# Patient Record
Sex: Female | Born: 1985 | Race: White | Hispanic: No | Marital: Married | Smoking: Never smoker
Health system: Southern US, Community
[De-identification: ages and names within clinical notes are randomized; demographics above are authoritative.]

## PROBLEM LIST (undated history)

## (undated) DIAGNOSIS — N926 Irregular menstruation, unspecified: Secondary | ICD-10-CM

## (undated) DIAGNOSIS — F909 Attention-deficit hyperactivity disorder, unspecified type: Secondary | ICD-10-CM

## (undated) DIAGNOSIS — N979 Female infertility, unspecified: Secondary | ICD-10-CM

## (undated) DIAGNOSIS — F419 Anxiety disorder, unspecified: Secondary | ICD-10-CM

## (undated) DIAGNOSIS — E282 Polycystic ovarian syndrome: Secondary | ICD-10-CM

## (undated) HISTORY — DX: Attention-deficit hyperactivity disorder, unspecified type: F90.9

## (undated) HISTORY — DX: Irregular menstruation, unspecified: N92.6

## (undated) HISTORY — DX: Anxiety disorder, unspecified: F41.9

## (undated) HISTORY — DX: Polycystic ovarian syndrome: E28.2

## (undated) HISTORY — DX: Female infertility, unspecified: N97.9

---

## 2017-12-08 ENCOUNTER — Ambulatory Visit (INDEPENDENT_AMBULATORY_CARE_PROVIDER_SITE_OTHER): Payer: Managed Care, Other (non HMO) | Admitting: Obstetrics and Gynecology

## 2017-12-08 ENCOUNTER — Other Ambulatory Visit (HOSPITAL_COMMUNITY)
Admission: RE | Admit: 2017-12-08 | Discharge: 2017-12-08 | Disposition: A | Discharge status: Home / Self Care | Payer: Managed Care, Other (non HMO) | Source: Ambulatory Visit | Attending: Obstetrics and Gynecology | Admitting: Obstetrics and Gynecology

## 2017-12-08 ENCOUNTER — Other Ambulatory Visit: Payer: Self-pay

## 2017-12-08 ENCOUNTER — Encounter: Payer: Self-pay | Admitting: Obstetrics and Gynecology

## 2017-12-08 VITALS — BP 110/64 | HR 64 | Resp 16 | Ht 68.25 in | Wt 191.0 lb

## 2017-12-08 DIAGNOSIS — R7989 Other specified abnormal findings of blood chemistry: Secondary | ICD-10-CM | POA: Diagnosis not present

## 2017-12-08 DIAGNOSIS — N946 Dysmenorrhea, unspecified: Secondary | ICD-10-CM

## 2017-12-08 DIAGNOSIS — F419 Anxiety disorder, unspecified: Secondary | ICD-10-CM | POA: Diagnosis not present

## 2017-12-08 DIAGNOSIS — Z3009 Encounter for other general counseling and advice on contraception: Secondary | ICD-10-CM | POA: Diagnosis not present

## 2017-12-08 DIAGNOSIS — Z113 Encounter for screening for infections with a predominantly sexual mode of transmission: Secondary | ICD-10-CM | POA: Insufficient documentation

## 2017-12-08 DIAGNOSIS — B9689 Other specified bacterial agents as the cause of diseases classified elsewhere: Secondary | ICD-10-CM | POA: Insufficient documentation

## 2017-12-08 DIAGNOSIS — Z124 Encounter for screening for malignant neoplasm of cervix: Secondary | ICD-10-CM | POA: Diagnosis not present

## 2017-12-08 DIAGNOSIS — Z01419 Encounter for gynecological examination (general) (routine) without abnormal findings: Secondary | ICD-10-CM | POA: Diagnosis not present

## 2017-12-08 MED ORDER — NAPROXEN SODIUM 550 MG PO TABS: 550.0000 mg | ORAL_TABLET | Freq: Two times a day (BID) | ORAL | 2 refills | Status: DC

## 2017-12-08 MED ORDER — ETONOGESTREL-ETHINYL ESTRADIOL 0.12-0.015 MG/24HR VA RING: VAGINAL_RING | VAGINAL | 3 refills | Status: DC

## 2017-12-08 MED ORDER — CITALOPRAM HYDROBROMIDE 20 MG PO TABS: ORAL_TABLET | ORAL | 1 refills | Status: DC

## 2017-12-08 NOTE — Patient Instructions (Signed)
EXERCISE AND DIET:  We recommended that you start or continue a regular exercise program for good health. Regular exercise means any activity that makes your heart beat faster and makes you sweat.  We recommend exercising at least 30 minutes per day at least 3 days a week, preferably 4 or 5.  We also recommend a diet low in fat and sugar.  Inactivity, poor dietary choices and obesity can cause diabetes, heart attack, stroke, and kidney damage, among others.    ALCOHOL AND SMOKING:  Women should limit their alcohol intake to no more than 7 drinks/beers/glasses of wine (combined, not each!) per week. Moderation of alcohol intake to this level decreases your risk of breast cancer and liver damage. And of course, no recreational drugs are part of a healthy lifestyle.  And absolutely no smoking or even second hand smoke. Most people know smoking can cause heart and lung diseases, but did you know it also contributes to weakening of your bones? Aging of your skin?  Yellowing of your teeth and nails?  CALCIUM AND VITAMIN D:  Adequate intake of calcium and Vitamin D are recommended.  The recommendations for exact amounts of these supplements seem to change often, but generally speaking 600 mg of calcium (either carbonate or citrate) and 800 units of Vitamin D per day seems prudent. Certain women may benefit from higher intake of Vitamin D.  If you are among these women, your doctor will have told you during your visit.    PAP SMEARS:  Pap smears, to check for cervical cancer or precancers,  have traditionally been done yearly, although recent scientific advances have shown that most women can have pap smears less often.  However, every woman still should have a physical exam from her gynecologist every year. It will include a breast check, inspection of the vulva and vagina to check for abnormal growths or skin changes, a visual exam of the cervix, and then an exam to evaluate the size and shape of the uterus and  ovaries.  And after 30 years of age, a rectal exam is indicated to check for rectal cancers. We will also provide age appropriate advice regarding health maintenance, like when you should have certain vaccines, screening for sexually transmitted diseases, bone density testing, colonoscopy, mammograms, etc.   MAMMOGRAMS:  All women over 40 years old should have a yearly mammogram. Many facilities now offer a "3D" mammogram, which may cost around $50 extra out of pocket. If possible,  we recommend you accept the option to have the 3D mammogram performed.  It both reduces the number of women who will be called back for extra views which then turn out to be normal, and it is better than the routine mammogram at detecting truly abnormal areas.    COLONOSCOPY:  Colonoscopy to screen for colon cancer is recommended for all women at age 50.  We know, you hate the idea of the prep.  We agree, BUT, having colon cancer and not knowing it is worse!!  Colon cancer so often starts as a polyp that can be seen and removed at colonscopy, which can quite literally save your life!  And if your first colonoscopy is normal and you have no family history of colon cancer, most women don't have to have it again for 10 years.  Once every ten years, you can do something that may end up saving your life, right?  We will be happy to help you get it scheduled when you are ready.    Be sure to check your insurance coverage so you understand how much it will cost.  It may be covered as a preventative service at no cost, but you should check your particular policy.     Ethinyl Estradiol; Etonogestrel vaginal ring What is this medicine? ETHINYL ESTRADIOL; ETONOGESTREL (ETH in il es tra DYE ole; et oh noe JES trel) vaginal ring is a flexible, vaginal ring used as a contraceptive (birth control method). This medicine combines two types of female hormones, an estrogen and a progestin. This ring is used to prevent ovulation and pregnancy. Each  ring is effective for one month. This medicine may be used for other purposes; ask your health care provider or pharmacist if you have questions. COMMON BRAND NAME(S): NuvaRing What should I tell my health care provider before I take this medicine? They need to know if you have or ever had any of these conditions: -abnormal vaginal bleeding -blood vessel disease or blood clots -breast, cervical, endometrial, ovarian, liver, or uterine cancer -diabetes -gallbladder disease -heart disease or recent heart attack -high blood pressure -high cholesterol -kidney disease -liver disease -migraine headaches -stroke -systemic lupus erythematosus (SLE) -tobacco smoker -an unusual or allergic reaction to estrogens, progestins, other medicines, foods, dyes, or preservatives -pregnant or trying to get pregnant -breast-feeding How should I use this medicine? Insert the ring into your vagina as directed. Follow the directions on the prescription label. The ring will remain place for 3 weeks and is then removed for a 1-week break. A new ring is inserted 1 week after the last ring was removed, on the same day of the week. Check often to make sure the ring is still in place, especially before and after sexual intercourse. If the ring was out of the vagina for an unknown amount of time, you may not be protected from pregnancy. Perform a pregnancy test and call your doctor. Do not use more often than directed. A patient package insert for the product will be given with each prescription and refill. Read this sheet carefully each time. The sheet may change frequently. Contact your pediatrician regarding the use of this medicine in children. Special care may be needed. This medicine has been used in female children who have started having menstrual periods. Overdosage: If you think you have taken too much of this medicine contact a poison control center or emergency room at once. NOTE: This medicine is only for  you. Do not share this medicine with others. What if I miss a dose? You will need to replace your vaginal ring once a month as directed. If the ring should slip out, or if you leave it in longer or shorter than you should, contact your health care professional for advice. What may interact with this medicine? Do not take this medicine with the following medication: -dasabuvir; ombitasvir; paritaprevir; ritonavir -ombitasvir; paritaprevir; ritonavir This medicine may also interact with the following medications: -acetaminophen -antibiotics or medicines for infections, especially rifampin, rifabutin, rifapentine, and griseofulvin, and possibly penicillins or tetracyclines -aprepitant -ascorbic acid (vitamin C) -atorvastatin -barbiturate medicines, such as phenobarbital -bosentan -carbamazepine -caffeine -clofibrate -cyclosporine -dantrolene -doxercalciferol -felbamate -grapefruit juice -hydrocortisone -medicines for anxiety or sleeping problems, such as diazepam or temazepam -medicines for diabetes, including pioglitazone -modafinil -mycophenolate -nefazodone -oxcarbazepine -phenytoin -prednisolone -ritonavir or other medicines for HIV infection or AIDS -rosuvastatin -selegiline -soy isoflavones supplements -St. John's wort -tamoxifen or raloxifene -theophylline -thyroid hormones -topiramate -warfarin This list may not describe all possible interactions. Give your health care provider a list   of all the medicines, herbs, non-prescription drugs, or dietary supplements you use. Also tell them if you smoke, drink alcohol, or use illegal drugs. Some items may interact with your medicine. What should I watch for while using this medicine? Visit your doctor or health care professional for regular checks on your progress. You will need a regular breast and pelvic exam and Pap smear while on this medicine. Use an additional method of contraception during the first cycle that you use  this ring. Do not use a diaphragm or female condom, as the ring can interfere with these birth control methods and their proper placement. If you have any reason to think you are pregnant, stop using this medicine right away and contact your doctor or health care professional. If you are using this medicine for hormone related problems, it may take several cycles of use to see improvement in your condition. Smoking increases the risk of getting a blood clot or having a stroke while you are using hormonal birth control, especially if you are more than 30 years old. You are strongly advised not to smoke. This medicine can make your body retain fluid, making your fingers, hands, or ankles swell. Your blood pressure can go up. Contact your doctor or health care professional if you feel you are retaining fluid. This medicine can make you more sensitive to the sun. Keep out of the sun. If you cannot avoid being in the sun, wear protective clothing and use sunscreen. Do not use sun lamps or tanning beds/booths. If you wear contact lenses and notice visual changes, or if the lenses begin to feel uncomfortable, consult your eye care specialist. In some women, tenderness, swelling, or minor bleeding of the gums may occur. Notify your dentist if this happens. Brushing and flossing your teeth regularly may help limit this. See your dentist regularly and inform your dentist of the medicines you are taking. If you are going to have elective surgery, you may need to stop using this medicine before the surgery. Consult your health care professional for advice. This medicine does not protect you against HIV infection (AIDS) or any other sexually transmitted diseases. What side effects may I notice from receiving this medicine? Side effects that you should report to your doctor or health care professional as soon as possible: -breast tissue changes or discharge -changes in vaginal bleeding during your period or between  your periods -chest pain -coughing up blood -dizziness or fainting spells -headaches or migraines -leg, arm or groin pain -severe or sudden headaches -stomach pain (severe) -sudden shortness of breath -sudden loss of coordination, especially on one side of the body -speech problems -symptoms of vaginal infection like itching, irritation or unusual discharge -tenderness in the upper abdomen -vomiting -weakness or numbness in the arms or legs, especially on one side of the body -yellowing of the eyes or skin Side effects that usually do not require medical attention (report to your doctor or health care professional if they continue or are bothersome): -breakthrough bleeding and spotting that continues beyond the 3 initial cycles of pills -breast tenderness -mood changes, anxiety, depression, frustration, anger, or emotional outbursts -increased sensitivity to sun or ultraviolet light -nausea -skin rash, acne, or brown spots on the skin -weight gain (slight) This list may not describe all possible side effects. Call your doctor for medical advice about side effects. You may report side effects to FDA at 1-800-FDA-1088. Where should I keep my medicine? Keep out of the reach of children. Store at   room temperature between 15 and 30 degrees C (59 and 86 degrees F) for up to 4 months. The product will expire after 4 months. Protect from light. Throw away any unused medicine after the expiration date. NOTE: This sheet is a summary. It may not cover all possible information. If you have questions about this medicine, talk to your doctor, pharmacist, or health care provider.  2018 Elsevier/Gold Standard (2016-07-29 17:00:31)  

## 2017-12-08 NOTE — Progress Notes (Signed)
Marland Kitchen 32 y.o. G0P0000 Single CaucasianF here for annual exam.   Cycles are typically 4-6 weeks x 5 weeks. She can saturate a super + tampon in 2 hours. No BTB. Cramps are mostly tolerable. Random bouts where they are severe.  A few years ago she bleed for a month. Went on off OCP's.  She has some hair growth on her chin, some acne on her chin. H/O PCOS.  She is sexually active with one person for the last few months. Wants to consider restarting OCP's or the nuvaring.  Period Duration (Days): 5 Period Pattern: (!) Irregular Menstrual Flow: Heavy Menstrual Control: Maxi pad, Tampon Dysmenorrhea: (!) Moderate Dysmenorrhea Symptoms: Cramping  H/O anxiety, worse lately. Recently moved to [place] (from out of [place]) for work. She is having trouble focusing. Working as a Estate agent and going to school. Gets stressed. Mostly wants to stay off of medication.   Patient's last menstrual period was 10/30/2017 (exact date).          Sexually active: Yes.    The current method of family planning is condoms sometimes.    Exercising: Yes.    yoga, lifting & cardio Smoker:  no  Health Maintenance: Pap:  2-4yrs ago History of abnormal Pap:  no MMG:  none Colonoscopy:  none BMD:   none TDaP:  unsure Gardasil: not done   reports that  has never smoked. she has never used smokeless tobacco. She reports that she drinks about 3.6 oz of alcohol per week. She reports that she does not use drugs.  Past Medical History:  Diagnosis Date  . Anxiety   . Infertility, female   . Irregular menstrual cycle   . PCOS (polycystic ovarian syndrome)     History reviewed. No pertinent surgical history.  Current Outpatient Medications  Medication Sig Dispense Refill  . Biotin w/ Vitamins C & E (HAIR/SKIN/NAILS PO) Take by mouth.    . Cholecalciferol (VITAMIN D PO) Take by mouth.    . Cyanocobalamin (B-12 PO) Take by mouth.    . Multiple Vitamins-Minerals (MULTIVITAMIN PO) Take by mouth.     No current  facility-administered medications for this visit.     Family History  Problem Relation Age of Onset  . Thyroid disease Mother   . Hypertension Sister   . Thyroid disease Sister   . Stroke Maternal Grandmother   . Heart failure Maternal Grandmother   . Prostate cancer Maternal Grandfather   . Diabetes Maternal Grandfather     Review of Systems  Exam:   BP 110/64   Pulse 64   Resp 16   Ht 5' 8.25" (1.734 m)   Wt 191 lb (86.6 kg)   LMP 10/30/2017 (Exact Date)   BMI 28.83 kg/m   Weight change: @WEIGHTCHANGE @ Height:   Height: 5' 8.25" (173.4 cm)  Ht Readings from Last 3 Encounters:  12/08/17 5' 8.25" (1.734 m)    General appearance: alert, cooperative and appears stated age Head: Normocephalic, without obvious abnormality, atraumatic Neck: no adenopathy, supple, symmetrical, trachea midline and thyroid normal to inspection and palpation Lungs: clear to auscultation bilaterally Cardiovascular: regular rate and rhythm Breasts: normal appearance, no masses or tenderness Abdomen: soft, non-tender; non distended,  no masses,  no organomegaly Extremities: extremities normal, atraumatic, no cyanosis or edema Skin: Skin color, texture, turgor normal. No rashes or lesions Lymph nodes: Cervical, supraclavicular, and axillary nodes normal. No abnormal inguinal nodes palpated Neurologic: Grossly normal   Pelvic: External genitalia:  no lesions  Urethra:  normal appearing urethra with no masses, tenderness or lesions              Bartholins and Skenes: normal                 Vagina: normal appearing vagina with normal color and discharge, no lesions              Cervix: no lesions               Bimanual Exam:  Uterus:  normal size, contour, position, consistency, mobility, non-tender              Adnexa: no mass, fullness, tenderness               Rectovaginal: Confirms               Anus:  normal sphincter tone, no lesions  Chaperone was present for exam.  A:  Well  Woman with normal exam  Recently told abnormal thyroid function with homeopath  Contraception, wants to start nuvaring, no contraindications, risks reviewed  Anxiety  Dysmenorrhea  P:   Labs just done with homeopathic MD  Thyroid panel  STD panel   Pap with reflex hpv, genprobe  Will start the nuvaring, risks reviewed  Start Celexa, f/u in 6 weeks (she prefers to 4 weeks)  Discussed breast self exam  Discussed calcium and vit D intake

## 2017-12-09 LAB — HEP, RPR, HIV PANEL
HIV Screen 4th Generation wRfx: NONREACTIVE
Hepatitis B Surface Ag: NEGATIVE
RPR Ser Ql: NONREACTIVE

## 2017-12-09 LAB — THYROID PANEL WITH TSH
FREE THYROXINE INDEX: 1.7 (ref 1.2–4.9)
T3 Uptake Ratio: 26 % (ref 24–39)
T4, Total: 6.6 ug/dL (ref 4.5–12.0)
TSH: 1.59 u[IU]/mL (ref 0.450–4.500)

## 2017-12-09 LAB — HEPATITIS C ANTIBODY

## 2017-12-11 LAB — CYTOLOGY - PAP
CHLAMYDIA, DNA PROBE: NEGATIVE
DIAGNOSIS: NEGATIVE
Neisseria Gonorrhea: NEGATIVE
Trichomonas: NEGATIVE

## 2017-12-12 ENCOUNTER — Telehealth: Payer: Self-pay | Admitting: *Deleted

## 2017-12-12 MED ORDER — FLUCONAZOLE 150 MG PO TABS: 150.0000 mg | ORAL_TABLET | Freq: Once | ORAL | 0 refills | Status: AC

## 2017-12-12 NOTE — Telephone Encounter (Signed)
-----   Message from Romualdo BolkJill Evelyn Jertson, MD sent at 12/12/2017 12:14 PM EST ----- 02 recall. Please inform + for yeast, negative for GC/CT/Trich. If she is symptomatic from the yeast, treat with diflucan 150 mg x 1, may repeat in 72 hours if still symptomatic. #2, no refills. If she isn't symptomatic she doesn't need treatment.

## 2017-12-12 NOTE — Telephone Encounter (Signed)
Spoke with patient and gave results and recommendations. Advised that PAP was still pending. Patient voiced understanding -eh ------

## 2018-01-10 ENCOUNTER — Telehealth: Payer: Self-pay | Admitting: Obstetrics and Gynecology

## 2018-01-10 NOTE — Telephone Encounter (Signed)
Patient called and cancelled her appointment with Dr. Oscar LaJertson on 01/18/18 for a 6 week recheck. She said she never started the medication and so she doesn't need the appointment.

## 2018-01-18 ENCOUNTER — Ambulatory Visit: Payer: Managed Care, Other (non HMO) | Admitting: Obstetrics and Gynecology

## 2018-10-18 ENCOUNTER — Other Ambulatory Visit: Payer: Self-pay | Admitting: Physician Assistant

## 2018-10-18 MED ORDER — AMPHETAMINE-DEXTROAMPHET ER 15 MG PO CP24: 15.0000 mg | ORAL_CAPSULE | ORAL | 0 refills | Status: DC

## 2018-11-16 ENCOUNTER — Telehealth: Payer: Self-pay | Admitting: Physician Assistant

## 2018-11-16 NOTE — Telephone Encounter (Signed)
Chart please 

## 2018-11-16 NOTE — Telephone Encounter (Signed)
Ana [name] called to request refill on her Adderall.  Next Appt. 12/13/18. [facility] [address]

## 2018-11-19 ENCOUNTER — Other Ambulatory Visit: Payer: Self-pay | Admitting: Physician Assistant

## 2018-11-19 MED ORDER — AMPHETAMINE-DEXTROAMPHET ER 15 MG PO CP24: 15.0000 mg | ORAL_CAPSULE | ORAL | 0 refills | Status: DC

## 2018-12-11 NOTE — Progress Notes (Signed)
33 y.o. G0P0000 Single White or Caucasian Not Hispanic or Latino female here for annual exam.  H/O PCOS. Not sexually active since May. Mostly uses condoms.  Cycles range from 1-2 months. Moderate flow. Intermittent cramping, no medication, can function. The pain is sharp and quick.   She was having a vaginal discharge, seems to have cleared up in the last few weeks. She has intermittent discomfort on her vulva, the skin gets dry and irritated. Sometimes itchy.   Period Duration (Days): 5 days Period Pattern: (!) Irregular(sometimes only occurs every other month) Menstrual Flow: Heavy Menstrual Control: Other (Comment) Menstrual Control Change Freq (Hours): emptying cup 3 times a day Dysmenorrhea: (!) Moderate Dysmenorrhea Symptoms: Cramping  Patient's last menstrual period was 11/22/2018 (exact date).          Sexually active: No.  The current method of family planning is none.    Exercising: Yes.    lifting, cardio Smoker:  no  Health Maintenance: Pap:  12/08/2017 WNL History of abnormal Pap:  no MMG:  None Colonoscopy:  None BMD:    None TDaP:  Patient is unsure, she will check. Gardasil: None, information given   reports that she has never smoked. She has never used smokeless tobacco. She reports current alcohol use of about 3.0 standard drinks of alcohol per week. She reports that she does not use drugs. Will graduate undergrad in the spring, degree in Conservation officer, nature. Work is stressful, but she like it. Has a good friend network. She works for a Nurse, learning disability.  Past Medical History:  Diagnosis Date  . Anxiety   . Infertility, female   . Irregular menstrual cycle   . PCOS (polycystic ovarian syndrome)     History reviewed. No pertinent surgical history.  Current Outpatient Medications  Medication Sig Dispense Refill  . amphetamine-dextroamphetamine (ADDERALL XR) 15 MG 24 hr capsule Take 1 capsule by mouth every morning. 30 capsule 0  .  amphetamine-dextroamphetamine (ADDERALL XR) 15 MG 24 hr capsule Take 1 capsule by mouth every morning. 30 capsule 0  . amphetamine-dextroamphetamine (ADDERALL XR) 15 MG 24 hr capsule Take 1 capsule by mouth every morning. 30 capsule 0  . amphetamine-dextroamphetamine (ADDERALL) 10 MG tablet Take 1 tablet (10 mg total) by mouth daily at 2 PM. 30 tablet 0  . naproxen sodium (ANAPROX DS) 550 MG tablet Take 1 tablet (550 mg total) by mouth 2 (two) times daily with a meal. (Patient not taking: Reported on 12/13/2018) 30 tablet 2   No current facility-administered medications for this visit.     Family History  Problem Relation Age of Onset  . Thyroid disease Mother   . Hypertension Sister   . Thyroid disease Sister   . Stroke Maternal Grandmother   . Heart failure Maternal Grandmother   . Prostate cancer Maternal Grandfather   . Diabetes Maternal Grandfather     Review of Systems  Constitutional: Negative.   HENT: Negative.   Eyes: Negative.   Respiratory: Negative.   Cardiovascular: Negative.   Gastrointestinal: Negative.   Endocrine: Negative.   Genitourinary: Positive for vaginal discharge and vaginal pain.  Musculoskeletal: Negative.   Skin: Negative.   Allergic/Immunologic: Negative.   Neurological: Negative.   Hematological: Negative.   Psychiatric/Behavioral: Negative.     Exam:   BP 134/78 (BP Location: Right Arm, Patient Position: Sitting, Cuff Size: Normal)   Pulse 72   Ht 5\' 8"  (1.727 m)   Wt 171 lb (77.6 kg)   LMP 11/22/2018 (Exact Date)  BMI 26.00 kg/m   Weight change: @WEIGHTCHANGE @ Height:   Height: 5\' 8"  (172.7 cm)  Ht Readings from Last 3 Encounters:  12/13/18 5\' 8"  (1.727 m)  12/08/17 5' 8.25" (1.734 m)    General appearance: alert, cooperative and appears stated age Head: Normocephalic, without obvious abnormality, atraumatic Neck: no adenopathy, supple, symmetrical, trachea midline and thyroid normal to inspection and palpation Lungs: clear to  auscultation bilaterally Cardiovascular: regular rate and rhythm Breasts: normal appearance, no masses or tenderness Abdomen: soft, non-tender; non distended,  no masses,  no organomegaly Extremities: extremities normal, atraumatic, no cyanosis or edema Skin: Skin color, texture, turgor normal. No rashes or lesions Lymph nodes: Cervical, supraclavicular, and axillary nodes normal. No abnormal inguinal nodes palpated Neurologic: Grossly normal   Pelvic: External genitalia:  no lesions              Urethra:  normal appearing urethra with no masses, tenderness or lesions              Bartholins and Skenes: normal                 Vagina: normal appearing vagina with normal color and discharge, no lesions              Cervix: no lesions               Bimanual Exam:  Uterus:  normal size, contour, position, consistency, mobility, non-tender and anteverted              Adnexa: no mass, fullness, tenderness               Rectovaginal: Confirms               Anus:  normal sphincter tone, no lesions  Chaperone was present for exam.  A:  Well Woman with normal exam  Intermittent vulvar irritation  Irregular cycles q 1-2 months, long term  H/O PCOS  P:   No pap this year  Information on gardasil given  Screening std  Screening labs  Discussed breast self exam  Discussed calcium and vit D intake

## 2018-12-13 ENCOUNTER — Ambulatory Visit (INDEPENDENT_AMBULATORY_CARE_PROVIDER_SITE_OTHER): Payer: Managed Care, Other (non HMO) | Admitting: Obstetrics and Gynecology

## 2018-12-13 ENCOUNTER — Ambulatory Visit (INDEPENDENT_AMBULATORY_CARE_PROVIDER_SITE_OTHER): Payer: Managed Care, Other (non HMO) | Admitting: Physician Assistant

## 2018-12-13 ENCOUNTER — Encounter: Payer: Self-pay | Admitting: Obstetrics and Gynecology

## 2018-12-13 ENCOUNTER — Encounter: Payer: Self-pay | Admitting: Physician Assistant

## 2018-12-13 ENCOUNTER — Other Ambulatory Visit: Payer: Self-pay

## 2018-12-13 VITALS — BP 134/78 | HR 72 | Ht 68.0 in | Wt 171.0 lb

## 2018-12-13 DIAGNOSIS — Z0000 Encounter for general adult medical examination without abnormal findings: Secondary | ICD-10-CM

## 2018-12-13 DIAGNOSIS — Z01419 Encounter for gynecological examination (general) (routine) without abnormal findings: Secondary | ICD-10-CM

## 2018-12-13 DIAGNOSIS — F900 Attention-deficit hyperactivity disorder, predominantly inattentive type: Secondary | ICD-10-CM

## 2018-12-13 DIAGNOSIS — N9089 Other specified noninflammatory disorders of vulva and perineum: Secondary | ICD-10-CM | POA: Diagnosis not present

## 2018-12-13 DIAGNOSIS — Z113 Encounter for screening for infections with a predominantly sexual mode of transmission: Secondary | ICD-10-CM | POA: Diagnosis not present

## 2018-12-13 MED ORDER — AMPHETAMINE-DEXTROAMPHET ER 15 MG PO CP24: 15.0000 mg | ORAL_CAPSULE | ORAL | 0 refills | Status: DC

## 2018-12-13 MED ORDER — AMPHETAMINE-DEXTROAMPHETAMINE 10 MG PO TABS: 10.0000 mg | ORAL_TABLET | Freq: Every day | ORAL | 0 refills | Status: DC

## 2018-12-13 NOTE — Patient Instructions (Signed)
EXERCISE AND DIET:  We recommended that you start or continue a regular exercise program for good health. Regular exercise means any activity that makes your heart beat faster and makes you sweat.  We recommend exercising at least 30 minutes per day at least 3 days a week, preferably 4 or 5.  We also recommend a diet low in fat and sugar.  Inactivity, poor dietary choices and obesity can cause diabetes, heart attack, stroke, and kidney damage, among others.    ALCOHOL AND SMOKING:  Women should limit their alcohol intake to no more than 7 drinks/beers/glasses of wine (combined, not each!) per week. Moderation of alcohol intake to this level decreases your risk of breast cancer and liver damage. And of course, no recreational drugs are part of a healthy lifestyle.  And absolutely no smoking or even second hand smoke. Most people know smoking can cause heart and lung diseases, but did you know it also contributes to weakening of your bones? Aging of your skin?  Yellowing of your teeth and nails?  CALCIUM AND VITAMIN D:  Adequate intake of calcium and Vitamin D are recommended.  The recommendations for exact amounts of these supplements seem to change often, but generally speaking 1,000 mg of calcium (between diet and supplement) and 800 units of Vitamin D per day seems prudent. Certain women may benefit from higher intake of Vitamin D.  If you are among these women, your doctor will have told you during your visit.    PAP SMEARS:  Pap smears, to check for cervical cancer or precancers,  have traditionally been done yearly, although recent scientific advances have shown that most women can have pap smears less often.  However, every woman still should have a physical exam from her gynecologist every year. It will include a breast check, inspection of the vulva and vagina to check for abnormal growths or skin changes, a visual exam of the cervix, and then an exam to evaluate the size and shape of the uterus and  ovaries.  And after 31 years of age, a rectal exam is indicated to check for rectal cancers. We will also provide age appropriate advice regarding health maintenance, like when you should have certain vaccines, screening for sexually transmitted diseases, bone density testing, colonoscopy, mammograms, etc.   MAMMOGRAMS:  All women over 40 years old should have a yearly mammogram. Many facilities now offer a "3D" mammogram, which may cost around $50 extra out of pocket. If possible,  we recommend you accept the option to have the 3D mammogram performed.  It both reduces the number of women who will be called back for extra views which then turn out to be normal, and it is better than the routine mammogram at detecting truly abnormal areas.    COLON CANCER SCREENING: Now recommend starting at age 45. At this time colonoscopy is not covered for routine screening until 50. There are take home tests that can be done between 45-49.   COLONOSCOPY:  Colonoscopy to screen for colon cancer is recommended for all women at age 50.  We know, you hate the idea of the prep.  We agree, BUT, having colon cancer and not knowing it is worse!!  Colon cancer so often starts as a polyp that can be seen and removed at colonscopy, which can quite literally save your life!  And if your first colonoscopy is normal and you have no family history of colon cancer, most women don't have to have it again for   10 years.  Once every ten years, you can do something that may end up saving your life, right?  We will be happy to help you get it scheduled when you are ready.  Be sure to check your insurance coverage so you understand how much it will cost.  It may be covered as a preventative service at no cost, but you should check your particular policy.      Breast Self-Awareness Breast self-awareness means being familiar with how your breasts look and feel. It involves checking your breasts regularly and reporting any changes to your  health care provider. Practicing breast self-awareness is important. A change in your breasts can be a sign of a serious medical problem. Being familiar with how your breasts look and feel allows you to find any problems early, when treatment is more likely to be successful. All women should practice breast self-awareness, including women who have had breast implants. How to do a breast self-exam One way to learn what is normal for your breasts and whether your breasts are changing is to do a breast self-exam. To do a breast self-exam: Look for Changes  1. Remove all the clothing above your waist. 2. Stand in front of a mirror in a room with good lighting. 3. Put your hands on your hips. 4. Push your hands firmly downward. 5. Compare your breasts in the mirror. Look for differences between them (asymmetry), such as: ? Differences in shape. ? Differences in size. ? Puckers, dips, and bumps in one breast and not the other. 6. Look at each breast for changes in your skin, such as: ? Redness. ? Scaly areas. 7. Look for changes in your nipples, such as: ? Discharge. ? Bleeding. ? Dimpling. ? Redness. ? A change in position. Feel for Changes Carefully feel your breasts for lumps and changes. It is best to do this while lying on your back on the floor and again while sitting or standing in the shower or tub with soapy water on your skin. Feel each breast in the following way:  Place the arm on the side of the breast you are examining above your head.  Feel your breast with the other hand.  Start in the nipple area and make  inch (2 cm) overlapping circles to feel your breast. Use the pads of your three middle fingers to do this. Apply light pressure, then medium pressure, then firm pressure. The light pressure will allow you to feel the tissue closest to the skin. The medium pressure will allow you to feel the tissue that is a little deeper. The firm pressure will allow you to feel the tissue  close to the ribs.  Continue the overlapping circles, moving downward over the breast until you feel your ribs below your breast.  Move one finger-width toward the center of the body. Continue to use the  inch (2 cm) overlapping circles to feel your breast as you move slowly up toward your collarbone.  Continue the up and down exam using all three pressures until you reach your armpit.  Write Down What You Find  Write down what is normal for each breast and any changes that you find. Keep a written record with breast changes or normal findings for each breast. By writing this information down, you do not need to depend only on memory for size, tenderness, or location. Write down where you are in your menstrual cycle, if you are still menstruating. If you are having trouble noticing differences   in your breasts, do not get discouraged. With time you will become more familiar with the variations in your breasts and more comfortable with the exam. How often should I examine my breasts? Examine your breasts every month. If you are breastfeeding, the best time to examine your breasts is after a feeding or after using a breast pump. If you menstruate, the best time to examine your breasts is 5-7 days after your period is over. During your period, your breasts are lumpier, and it may be more difficult to notice changes. When should I see my health care provider? See your health care provider if you notice:  A change in shape or size of your breasts or nipples.  A change in the skin of your breast or nipples, such as a reddened or scaly area.  Unusual discharge from your nipples.  A lump or thick area that was not there before.  Pain in your breasts.  Anything that concerns you.  

## 2018-12-13 NOTE — Progress Notes (Signed)
[facility]  Patient ID: Ana [name],  MRN: [id number]  PCP: Patient, No Pcp Per  Date of Evaluation: 12/13/2018 Time spent:15 minutes  Chief Complaint:  Chief Complaint    ADD      HISTORY/CURRENT STATUS: HPI For routine med check.  Had been taking the Adderall XR sporadically, but notices that if she doesn't take it, she's a little shorter fused and certainly not as focused.  She wonders if she had something to take in the early afternoon sometimes that would be helpful.  The Adderall XR does not always last throughout the day.  Individual Medical History/ Review of Systems: Changes? :No   Allergies: Patient has no known allergies.  Current Medications:  Current Outpatient Medications:  .  amphetamine-dextroamphetamine (ADDERALL XR) 15 MG 24 hr capsule, Take 1 capsule by mouth every morning., Disp: 30 capsule, Rfl: 0 .  amphetamine-dextroamphetamine (ADDERALL XR) 15 MG 24 hr capsule, Take 1 capsule by mouth every morning., Disp: 30 capsule, Rfl: 0 .  etonogestrel-ethinyl estradiol (NUVARING) 0.12-0.015 MG/24HR vaginal ring, Insert vaginally and leave in place for 3 consecutive weeks, then remove for 1 week., Disp: 3 each, Rfl: 3 .  naproxen sodium (ANAPROX DS) 550 MG tablet, Take 1 tablet (550 mg total) by mouth 2 (two) times daily with a meal., Disp: 30 tablet, Rfl: 2 .  amphetamine-dextroamphetamine (ADDERALL XR) 15 MG 24 hr capsule, Take 1 capsule by mouth every morning., Disp: 30 capsule, Rfl: 0 .  amphetamine-dextroamphetamine (ADDERALL) 10 MG tablet, Take 1 tablet (10 mg total) by mouth daily at 2 PM., Disp: 30 tablet, Rfl: 0 .  Biotin w/ Vitamins C & E (HAIR/SKIN/NAILS PO), Take by mouth., Disp: , Rfl:  .  Cholecalciferol (VITAMIN D PO), Take by mouth., Disp: , Rfl:  .  Cyanocobalamin (B-12 PO), Take by mouth., Disp: , Rfl:  .  Multiple Vitamins-Minerals (MULTIVITAMIN PO), Take by mouth., Disp: , Rfl:  Medication Side Effects: none  Family Medical/  Social History: Changes? No  MENTAL HEALTH EXAM:  There were no vitals taken for this visit.There is no height or weight on file to calculate BMI.  General Appearance: Casual and Well Groomed  Eye Contact:  Good  Speech:  Clear and Coherent  Volume:  Normal  Mood:  Euthymic  Affect:  Appropriate  Thought Process:  Goal Directed  Orientation:  Full (Time, Place, and Person)  Thought Content: Logical   Suicidal Thoughts:  No  Homicidal Thoughts:  No  Memory:  WNL  Judgement:  Good  Insight:  Good  Psychomotor Activity:  Normal  Concentration:  Concentration: Good  Recall:  Good  Fund of Knowledge: Good  Language: Good  Assets:  Desire for Improvement  ADL's:  Intact  Cognition: WNL  Prognosis:  Good    DIAGNOSES:    ICD-10-CM   1. Attention deficit hyperactivity disorder (ADHD), predominantly inattentive type F90.0     Receiving Psychotherapy: No    RECOMMENDATIONS: Continue Adderall XR 15 mg 1 p.o. every morning. Start Adderall 10 mg 1 p.o. daily anywhere from 12 to 3 PM as needed. We discussed behavioral aspects of focus and attention.  She is trying to make some changes that will be helpful, on top of using the medication. Return in 4 weeks.   Melony Overly, PA-C

## 2018-12-14 LAB — COMPREHENSIVE METABOLIC PANEL
ALK PHOS: 72 IU/L (ref 39–117)
ALT: 11 IU/L (ref 0–32)
AST: 33 IU/L (ref 0–40)
Albumin/Globulin Ratio: 1.9 (ref 1.2–2.2)
Albumin: 4.6 g/dL (ref 3.5–5.5)
BILIRUBIN TOTAL: 0.3 mg/dL (ref 0.0–1.2)
BUN/Creatinine Ratio: 11 (ref 9–23)
BUN: 9 mg/dL (ref 6–20)
CHLORIDE: 102 mmol/L (ref 96–106)
CO2: 24 mmol/L (ref 20–29)
CREATININE: 0.81 mg/dL (ref 0.57–1.00)
Calcium: 9.4 mg/dL (ref 8.7–10.2)
GFR calc Af Amer: 113 mL/min/{1.73_m2} (ref >59)
GFR calc non Af Amer: 98 mL/min/{1.73_m2} (ref >59)
Globulin, Total: 2.4 g/dL (ref 1.5–4.5)
Glucose: 86 mg/dL (ref 65–99)
POTASSIUM: 4 mmol/L (ref 3.5–5.2)
Sodium: 141 mmol/L (ref 134–144)
Total Protein: 7 g/dL (ref 6.0–8.5)

## 2018-12-14 LAB — CBC
HEMATOCRIT: 37.6 % (ref 34.0–46.6)
Hemoglobin: 12.5 g/dL (ref 11.1–15.9)
MCH: 30.6 pg (ref 26.6–33.0)
MCHC: 33.2 g/dL (ref 31.5–35.7)
MCV: 92 fL (ref 79–97)
Platelets: 350 10*3/uL (ref 150–450)
RBC: 4.08 x10E6/uL (ref 3.77–5.28)
RDW: 13.1 % (ref 11.7–15.4)
WBC: 6.5 10*3/uL (ref 3.4–10.8)

## 2018-12-14 LAB — VAGINITIS/VAGINOSIS, DNA PROBE
Candida Species: NEGATIVE
Gardnerella vaginalis: NEGATIVE
Trichomonas vaginosis: NEGATIVE

## 2018-12-14 LAB — HIV ANTIBODY (ROUTINE TESTING W REFLEX): HIV Screen 4th Generation wRfx: NONREACTIVE

## 2018-12-14 LAB — RPR: RPR Ser Ql: NONREACTIVE

## 2018-12-14 LAB — LIPID PANEL
CHOLESTEROL TOTAL: 160 mg/dL (ref 100–199)
Chol/HDL Ratio: 2.4 ratio (ref 0.0–4.4)
HDL: 68 mg/dL (ref >39)
LDL Calculated: 83 mg/dL (ref 0–99)
TRIGLYCERIDES: 44 mg/dL (ref 0–149)
VLDL Cholesterol Cal: 9 mg/dL (ref 5–40)

## 2018-12-14 LAB — GC/CHLAMYDIA PROBE AMP
CHLAMYDIA, DNA PROBE: NEGATIVE
NEISSERIA GONORRHOEAE BY PCR: NEGATIVE

## 2019-01-14 ENCOUNTER — Encounter: Payer: Self-pay | Admitting: Physician Assistant

## 2019-01-14 ENCOUNTER — Ambulatory Visit (INDEPENDENT_AMBULATORY_CARE_PROVIDER_SITE_OTHER): Payer: 59 | Admitting: Physician Assistant

## 2019-01-14 VITALS — BP 111/70 | HR 89

## 2019-01-14 DIAGNOSIS — F900 Attention-deficit hyperactivity disorder, predominantly inattentive type: Secondary | ICD-10-CM | POA: Diagnosis not present

## 2019-01-14 DIAGNOSIS — E282 Polycystic ovarian syndrome: Secondary | ICD-10-CM | POA: Insufficient documentation

## 2019-01-14 MED ORDER — AMPHETAMINE-DEXTROAMPHET ER 15 MG PO CP24: 15.0000 mg | ORAL_CAPSULE | ORAL | 0 refills | Status: DC

## 2019-01-14 MED ORDER — AMPHETAMINE-DEXTROAMPHETAMINE 10 MG PO TABS: 10.0000 mg | ORAL_TABLET | Freq: Every day | ORAL | 0 refills | Status: DC

## 2019-01-14 NOTE — Progress Notes (Signed)
[facility]  Patient ID: Ana [name],  MRN: [id number]  PCP: Patient, No Pcp Per  Date of Evaluation: 01/14/19 Time spent:15 minutes  Chief Complaint:  Chief Complaint    Follow-up      HISTORY/CURRENT STATUS: HPI here for one-month follow-up for ADHD.  At the last visit, we added in Adderall 10 mg to be taken in the afternoon if needed.  She has had good success with that in addition to the Adderall XR 15 mg in the morning.  States there are days that she does not need to 10 mg in the afternoon.  Or sometimes especially on weekends she does not need the extended release 15 mg so will only take the 10 mg short acting.  This seems to be working very well.  She is able to focus and finish tasks that she was not able to do before she started the Adderall.  She denies any tachycardia, palpitations, extreme decreased appetite or weight loss.  Individual Medical History/ Review of Systems: Changes? :No    Past medications for mental health diagnoses include: None  Allergies: Patient has no known allergies.  Current Medications:  Current Outpatient Medications:  .  amphetamine-dextroamphetamine (ADDERALL XR) 15 MG 24 hr capsule, Take 1 capsule by mouth every morning., Disp: 30 capsule, Rfl: 0 .  [START ON 02/11/2019] amphetamine-dextroamphetamine (ADDERALL XR) 15 MG 24 hr capsule, Take 1 capsule by mouth every morning., Disp: 30 capsule, Rfl: 0 .  [START ON 03/13/2019] amphetamine-dextroamphetamine (ADDERALL XR) 15 MG 24 hr capsule, Take 1 capsule by mouth every morning., Disp: 30 capsule, Rfl: 0 .  amphetamine-dextroamphetamine (ADDERALL) 10 MG tablet, Take 1 tablet (10 mg total) by mouth daily at 12 noon., Disp: 30 tablet, Rfl: 0 .  [START ON 02/11/2019] amphetamine-dextroamphetamine (ADDERALL) 10 MG tablet, Take 1 tablet (10 mg total) by mouth daily at 12 noon., Disp: 30 tablet, Rfl: 0 .  [START ON 03/13/2019] amphetamine-dextroamphetamine (ADDERALL) 10 MG tablet, Take 1  tablet (10 mg total) by mouth daily at 12 noon., Disp: 30 tablet, Rfl: 0 .  naproxen sodium (ANAPROX DS) 550 MG tablet, Take 1 tablet (550 mg total) by mouth 2 (two) times daily with a meal. (Patient not taking: Reported on 01/14/2019), Disp: 30 tablet, Rfl: 2 Medication Side Effects: none  Family Medical/ Social History: Changes? No  MENTAL HEALTH EXAM:  Blood pressure 111/70, pulse 89.There is no height or weight on file to calculate BMI.  General Appearance: Casual and Well Groomed  Eye Contact:  Good  Speech:  Clear and Coherent  Volume:  Normal  Mood:  Euthymic  Affect:  Appropriate  Thought Process:  Goal Directed  Orientation:  Full (Time, Place, and Person)  Thought Content: Logical   Suicidal Thoughts:  No  Homicidal Thoughts:  No  Memory:  WNL  Judgement:  Good  Insight:  Good  Psychomotor Activity:  Normal  Concentration:  Concentration: Good and Attention Span: Good  Recall:  Good  Fund of Knowledge: Good  Language: Good  Assets:  Desire for Improvement  ADL's:  Intact  Cognition: WNL  Prognosis:  Good    DIAGNOSES:    ICD-10-CM   1. Attention deficit hyperactivity disorder (ADHD), predominantly inattentive type F90.0     Receiving Psychotherapy: No    RECOMMENDATIONS: PDMP was reviewed. Continue Adderall XR 15 mg p.o. every morning as needed. Continue Adderall 10 mg q. afternoon as needed. I have sent in 3 prescriptions for each of the above.  When she  is on the third set, she should call the office and let me know that she needs next 3 months and I can send those in. Return in 6 months or sooner as needed.  Melony Overly, PA-C

## 2019-07-15 ENCOUNTER — Ambulatory Visit: Payer: 59 | Admitting: Physician Assistant

## 2019-07-15 ENCOUNTER — Other Ambulatory Visit: Payer: Self-pay

## 2019-10-01 ENCOUNTER — Emergency Department (HOSPITAL_COMMUNITY): Payer: Managed Care, Other (non HMO)

## 2019-10-01 ENCOUNTER — Other Ambulatory Visit: Payer: Self-pay

## 2019-10-01 ENCOUNTER — Encounter (HOSPITAL_COMMUNITY): Payer: Self-pay | Admitting: Emergency Medicine

## 2019-10-01 ENCOUNTER — Emergency Department (HOSPITAL_COMMUNITY)
Admission: EM | Admit: 2019-10-01 | Discharge: 2019-10-01 | Disposition: A | Discharge status: Home / Self Care | Payer: Managed Care, Other (non HMO) | Attending: Emergency Medicine | Admitting: Emergency Medicine

## 2019-10-01 DIAGNOSIS — Z79899 Other long term (current) drug therapy: Secondary | ICD-10-CM | POA: Insufficient documentation

## 2019-10-01 DIAGNOSIS — R109 Unspecified abdominal pain: Secondary | ICD-10-CM | POA: Insufficient documentation

## 2019-10-01 LAB — BASIC METABOLIC PANEL
Anion gap: 7 (ref 5–15)
BUN: 8 mg/dL (ref 6–20)
CO2: 23 mmol/L (ref 22–32)
Calcium: 8.9 mg/dL (ref 8.9–10.3)
Chloride: 107 mmol/L (ref 98–111)
Creatinine, Ser: 0.85 mg/dL (ref 0.44–1.00)
GFR calc Af Amer: >60 mL/min (ref >60)
GFR calc non Af Amer: >60 mL/min (ref >60)
Glucose, Bld: 89 mg/dL (ref 70–99)
Potassium: 3.9 mmol/L (ref 3.5–5.1)
Sodium: 137 mmol/L (ref 135–145)

## 2019-10-01 LAB — CBC WITH DIFFERENTIAL/PLATELET
Abs Immature Granulocytes: 0.02 10*3/uL (ref 0.00–0.07)
Basophils Absolute: 0 10*3/uL (ref 0.0–0.1)
Basophils Relative: 0 %
Eosinophils Absolute: 0.2 10*3/uL (ref 0.0–0.5)
Eosinophils Relative: 5 %
HCT: 37.4 % (ref 36.0–46.0)
Hemoglobin: 12.1 g/dL (ref 12.0–15.0)
Immature Granulocytes: 0 %
Lymphocytes Relative: 22 %
Lymphs Abs: 1.1 10*3/uL (ref 0.7–4.0)
MCH: 29.5 pg (ref 26.0–34.0)
MCHC: 32.4 g/dL (ref 30.0–36.0)
MCV: 91.2 fL (ref 80.0–100.0)
Monocytes Absolute: 0.4 10*3/uL (ref 0.1–1.0)
Monocytes Relative: 8 %
Neutro Abs: 3.2 10*3/uL (ref 1.7–7.7)
Neutrophils Relative %: 65 %
Platelets: 280 10*3/uL (ref 150–400)
RBC: 4.1 MIL/uL (ref 3.87–5.11)
RDW: 13.5 % (ref 11.5–15.5)
WBC: 4.9 10*3/uL (ref 4.0–10.5)
nRBC: 0 % (ref 0.0–0.2)

## 2019-10-01 LAB — URINALYSIS, ROUTINE W REFLEX MICROSCOPIC
Bilirubin Urine: NEGATIVE
Glucose, UA: NEGATIVE mg/dL
Hgb urine dipstick: NEGATIVE
Ketones, ur: NEGATIVE mg/dL
Leukocytes,Ua: NEGATIVE
Nitrite: NEGATIVE
Protein, ur: NEGATIVE mg/dL
Specific Gravity, Urine: 1.003 — ABNORMAL LOW (ref 1.005–1.030)
pH: 8 (ref 5.0–8.0)

## 2019-10-01 LAB — POC URINE PREG, ED: Preg Test, Ur: NEGATIVE

## 2019-10-01 MED ORDER — NAPROXEN 500 MG PO TABS: 500.0000 mg | ORAL_TABLET | Freq: Two times a day (BID) | ORAL | 0 refills | Status: DC

## 2019-10-01 MED ORDER — KETOROLAC TROMETHAMINE 15 MG/ML IJ SOLN
15.0000 mg | Freq: Once | INTRAMUSCULAR | Status: AC
  Administered 2019-10-01: 15 mg via INTRAVENOUS
  Filled 2019-10-01: qty 1

## 2019-10-01 NOTE — ED Provider Notes (Signed)
[facility] DEPT Provider Note   CSN: [id number] Arrival date & time: 10/01/19  1023     History   Chief Complaint Chief Complaint  Patient presents with  . Flank Pain    HPI Ana [name] is a 33 y.o. female.  Presents emerged department with chief complaint of right-sided abdominal, flank pain.  Pain since yesterday, today getting worse.  Currently 6 out of 10 in severity, sharp, stabbing pain, seems to come and go in waves.  No vomiting, has had some intermittent nausea.  No fevers.  Denies any lower pelvic pain, no dysuria, hematuria, vaginal discharge.  States she had prior history of ovarian cyst.  No prior history of sexually transmitted diseases.  No prior history of nephrolithiasis, appendicitis, TOA, PID.     HPI  Past Medical History:  Diagnosis Date  . Anxiety   . Infertility, female   . Irregular menstrual cycle   . PCOS (polycystic ovarian syndrome)     Patient Active Problem List   Diagnosis Date Noted  . PCOS (polycystic ovarian syndrome) 01/14/2019    History reviewed. No pertinent surgical history.   OB History    Gravida  0   Para  0   Term  0   Preterm  0   AB  0   Living  0     SAB  0   TAB  0   Ectopic  0   Multiple  0   Live Births  0            Home Medications    Prior to Admission medications   Medication Sig Start Date End Date Taking? Authorizing Provider  acetaminophen (TYLENOL) 325 MG tablet Take 650 mg by mouth every 6 (six) hours as needed for mild pain.   Yes [provider]  Multiple Vitamin (MULTIVITAMIN) tablet Take 1 tablet by mouth daily.   Yes [provider]  amphetamine-dextroamphetamine (ADDERALL XR) 15 MG 24 hr capsule Take 1 capsule by mouth every morning. Patient not taking: Reported on 10/01/2019 01/14/19   Addison Lank, PA-C  amphetamine-dextroamphetamine (ADDERALL XR) 15 MG 24 hr capsule Take 1 capsule by mouth every morning. Patient not  taking: Reported on 10/01/2019 02/11/19   Addison Lank, PA-C  amphetamine-dextroamphetamine (ADDERALL XR) 15 MG 24 hr capsule Take 1 capsule by mouth every morning. Patient not taking: Reported on 10/01/2019 03/13/19   Donnal Moat T, PA-C  amphetamine-dextroamphetamine (ADDERALL) 10 MG tablet Take 1 tablet (10 mg total) by mouth daily at 12 noon. Patient not taking: Reported on 10/01/2019 01/14/19   Donnal Moat T, PA-C  amphetamine-dextroamphetamine (ADDERALL) 10 MG tablet Take 1 tablet (10 mg total) by mouth daily at 12 noon. Patient not taking: Reported on 10/01/2019 02/11/19   Donnal Moat T, PA-C  amphetamine-dextroamphetamine (ADDERALL) 10 MG tablet Take 1 tablet (10 mg total) by mouth daily at 12 noon. Patient not taking: Reported on 10/01/2019 03/13/19   Donnal Moat T, PA-C  naproxen (NAPROSYN) 500 MG tablet Take 1 tablet (500 mg total) by mouth 2 (two) times daily. 10/01/19   Lucrezia Starch, MD  naproxen sodium (ANAPROX DS) 550 MG tablet Take 1 tablet (550 mg total) by mouth 2 (two) times daily with a meal. Patient not taking: Reported on 01/14/2019 12/08/17   Salvadore Dom, MD    Family History Family History  Problem Relation Age of Onset  . Thyroid disease Mother   . Hypertension Sister   .  Thyroid disease Sister   . Stroke Maternal Grandmother   . Heart failure Maternal Grandmother   . Prostate cancer Maternal Grandfather   . Diabetes Maternal Grandfather     Social History Social History   Tobacco Use  . Smoking status: Never Smoker  . Smokeless tobacco: Never Used  Substance Use Topics  . Alcohol use: Yes    Alcohol/week: 3.0 standard drinks    Types: 3 Standard drinks or equivalent per week    Comment:  per week  . Drug use: No     Allergies   Patient has no known allergies.   Review of Systems Review of Systems  Constitutional: Negative for chills and fever.  HENT: Negative for ear pain and sore throat.   Eyes: Negative for pain and visual  disturbance.  Respiratory: Negative for cough and shortness of breath.   Cardiovascular: Negative for chest pain and palpitations.  Gastrointestinal: Negative for abdominal pain and vomiting.  Genitourinary: Positive for flank pain. Negative for dysuria and hematuria.  Musculoskeletal: Negative for arthralgias and back pain.  Skin: Negative for color change and rash.  Neurological: Negative for seizures and syncope.  All other systems reviewed and are negative.    Physical Exam Updated Vital Signs BP 106/70   Pulse (!) 54   Temp 98.2 F (36.8 C) (Oral)   Resp 16   SpO2 100%   Physical Exam Vitals signs and nursing note reviewed.  Constitutional:      General: She is not in acute distress.    Appearance: She is well-developed.  HENT:     Head: Normocephalic and atraumatic.  Eyes:     Conjunctiva/sclera: Conjunctivae normal.  Neck:     Musculoskeletal: Neck supple.  Cardiovascular:     Rate and Rhythm: Normal rate and regular rhythm.     Heart sounds: No murmur.  Pulmonary:     Effort: Pulmonary effort is normal. No respiratory distress.     Breath sounds: Normal breath sounds.  Abdominal:     Palpations: Abdomen is soft.     Tenderness: There is no abdominal tenderness.  Musculoskeletal:     Comments: Mild tenderness to right flank, no CVA tenderness bilaterally  Skin:    General: Skin is warm and dry.  Neurological:     General: No focal deficit present.     Mental Status: She is alert and oriented to person, place, and time.  Psychiatric:        Mood and Affect: Mood normal.      ED Treatments / Results  Labs (all labs ordered are listed, but only abnormal results are displayed) Labs Reviewed  URINALYSIS, ROUTINE W REFLEX MICROSCOPIC - Abnormal; Notable for the following components:      Result Value   Color, Urine STRAW (*)    Specific Gravity, Urine 1.003 (*)    All other components within normal limits  CBC WITH DIFFERENTIAL/PLATELET  BASIC  METABOLIC PANEL  POC URINE PREG, ED    EKG None  Radiology US Transvaginal Non-ob  Result Date: 10/01/2019 CLINICAL DATA:  Right lower quadrant pain. EXAM: TRANSABDOMINAL AND TRANSVAGINAL ULTRASOUND OF PELVIS DOPPLER ULTRASOUND OF OVARIES TECHNIQUE: Both transabdominal and transvaginal ultrasound examinations of the pelvis were performed. Transabdominal technique was performed for global imaging of the pelvis including uterus, ovaries, adnexal regions, and pelvic cul-de-sac. It was necessary to proceed with endovaginal exam following the transabdominal exam to visualize the endometrium and ovaries. Color and duplex Doppler ultrasound was utilized to evaluate blood  flow to the ovaries. COMPARISON:  CT abdomen pelvis from same day. FINDINGS: Uterus Measurements: 9.4 x 3.0 x 5.0 cm = volume: 74 mL. No fibroids or other mass visualized. Endometrium Thickness: 8 mm.  No focal abnormality visualized. Right ovary Measurements: 7.6 x 6.1 x 7.9 cm = volume: 188 mL. 6.1 x 5.9 x 4.9 cm simple cyst. Left ovary Measurements: 3.9 x 2.4 x 3.3 cm = volume: 16 mL. Normal appearance/no adnexal mass. Pulsed Doppler evaluation of both ovaries demonstrates normal low-resistance arterial and venous waveforms. Other findings Trace free fluid in the pelvis, likely physiologic. IMPRESSION: 1. No acute abnormality. No sonographic evidence of ovarian torsion. 2. 6.1 cm simple cyst in the right ovary. This has benign characteristics. No imaging follow up is required for premenopausal females. This follows consensus guidelines: Simple Adnexal Cysts: SRU Consensus Conference Update on Follow-up and Reporting. Radiology 2019; [id number]. Electronically Signed   By: Obie DredgeWilliam T Derry M.D.   On: 10/01/2019 14:02   [place] Pelvis Complete  Result Date: 10/01/2019 CLINICAL DATA:  Right lower quadrant pain. EXAM: TRANSABDOMINAL AND TRANSVAGINAL ULTRASOUND OF PELVIS DOPPLER ULTRASOUND OF OVARIES TECHNIQUE: Both transabdominal and transvaginal  ultrasound examinations of the pelvis were performed. Transabdominal technique was performed for global imaging of the pelvis including uterus, ovaries, adnexal regions, and pelvic cul-de-sac. It was necessary to proceed with endovaginal exam following the transabdominal exam to visualize the endometrium and ovaries. Color and duplex Doppler ultrasound was utilized to evaluate blood flow to the ovaries. COMPARISON:  CT abdomen pelvis from same day. FINDINGS: Uterus Measurements: 9.4 x 3.0 x 5.0 cm = volume: 74 mL. No fibroids or other mass visualized. Endometrium Thickness: 8 mm.  No focal abnormality visualized. Right ovary Measurements: 7.6 x 6.1 x 7.9 cm = volume: 188 mL. 6.1 x 5.9 x 4.9 cm simple cyst. Left ovary Measurements: 3.9 x 2.4 x 3.3 cm = volume: 16 mL. Normal appearance/no adnexal mass. Pulsed Doppler evaluation of both ovaries demonstrates normal low-resistance arterial and venous waveforms. Other findings Trace free fluid in the pelvis, likely physiologic. IMPRESSION: 1. No acute abnormality. No sonographic evidence of ovarian torsion. 2. 6.1 cm simple cyst in the right ovary. This has benign characteristics. No imaging follow up is required for premenopausal females. This follows consensus guidelines: Simple Adnexal Cysts: SRU Consensus Conference Update on Follow-up and Reporting. Radiology 2019; [id number]. Electronically Signed   By: Obie DredgeWilliam T Derry M.D.   On: 10/01/2019 14:02   [place] Art/ven Flow Abd Pelv Doppler  Result Date: 10/01/2019 CLINICAL DATA:  Right lower quadrant pain. EXAM: TRANSABDOMINAL AND TRANSVAGINAL ULTRASOUND OF PELVIS DOPPLER ULTRASOUND OF OVARIES TECHNIQUE: Both transabdominal and transvaginal ultrasound examinations of the pelvis were performed. Transabdominal technique was performed for global imaging of the pelvis including uterus, ovaries, adnexal regions, and pelvic cul-de-sac. It was necessary to proceed with endovaginal exam following the transabdominal exam to  visualize the endometrium and ovaries. Color and duplex Doppler ultrasound was utilized to evaluate blood flow to the ovaries. COMPARISON:  CT abdomen pelvis from same day. FINDINGS: Uterus Measurements: 9.4 x 3.0 x 5.0 cm = volume: 74 mL. No fibroids or other mass visualized. Endometrium Thickness: 8 mm.  No focal abnormality visualized. Right ovary Measurements: 7.6 x 6.1 x 7.9 cm = volume: 188 mL. 6.1 x 5.9 x 4.9 cm simple cyst. Left ovary Measurements: 3.9 x 2.4 x 3.3 cm = volume: 16 mL. Normal appearance/no adnexal mass. Pulsed Doppler evaluation of both ovaries demonstrates normal low-resistance arterial and venous waveforms. Other findings  Trace free fluid in the pelvis, likely physiologic. IMPRESSION: 1. No acute abnormality. No sonographic evidence of ovarian torsion. 2. 6.1 cm simple cyst in the right ovary. This has benign characteristics. No imaging follow up is required for premenopausal females. This follows consensus guidelines: Simple Adnexal Cysts: SRU Consensus Conference Update on Follow-up and Reporting. Radiology 2019; [id number]. Electronically Signed   By: Obie Dredge M.D.   On: 10/01/2019 14:02   Ct Renal Stone Study  Result Date: 10/01/2019 CLINICAL DATA:  RIGHT flank pain since yesterday question kidney stone EXAM: CT ABDOMEN AND PELVIS WITHOUT CONTRAST TECHNIQUE: Multidetector CT imaging of the abdomen and pelvis was performed following the standard protocol without IV contrast. Sagittal and coronal MPR images reconstructed from axial data set. Oral contrast was not administered for this indication. COMPARISON:  None FINDINGS: Lower chest: Lung bases clear Hepatobiliary: Probable small cyst anterior liver near falciform fissure image 29. Gallbladder liver otherwise normal appearance Pancreas: Normal appearance Spleen: Normal appearance Adrenals/Urinary Tract: Adrenal glands, kidneys, ureters, and bladder normal appearance. No definite urinary tract calcification or dilatation.  Stomach/Bowel: Normal appendix, retrocecal. Stomach decompressed. Bowel loops unremarkable. Vascular/Lymphatic: Vascular structures grossly unremarkable for noncontrast technique. Aorta normal caliber. No adenopathy. Reproductive: Unremarkable uterus and LEFT ovary. Large cyst RIGHT ovary 6.2 x 5.4 x 6.0 cm without surrounding infiltrative changes. Other: No free free air or free fluid. No hernia or acute inflammatory process. Musculoskeletal: Unremarkable IMPRESSION: No evidence of urinary tract calcification or dilatation. Normal appendix. Large cyst RIGHT ovary 6.2 cm greatest size; recommend characterization by pelvic sonography. Electronically Signed   By: Ulyses Southward M.D.   On: 10/01/2019 12:45    Procedures Procedures (including critical care time)  Medications Ordered in ED Medications  ketorolac (TORADOL) 15 MG/ML injection 15 mg (15 mg Intravenous Given 10/01/19 1253)  ketorolac (TORADOL) 15 MG/ML injection 15 mg (15 mg Intravenous Given 10/01/19 1441)     Initial Impression / Assessment and Plan / ED Course  I have reviewed the triage vital signs and the nursing notes.  Pertinent labs & imaging results that were available during my care of the patient were reviewed by me and considered in my medical decision making (see chart for details).  Clinical Course as of Sep 30 1634  Tue Oct 01, 2019  1320 Rechecked patient, pain improved, plan for TVUS   [RD]  1417 Rechecked, will dc home   [RD]    Clinical Course User Index [RD] Milagros Loll, MD      33 year old lady presents to ER with right flank pain.  Labs within normal limits.  Urine negative for infection.  CT renal stone study negative for nephrolithiasis, pyelonephritis, appendicitis.  Did demonstrate large ovarian cyst.  Transvaginal ultrasound was performed, negative for torsion, no evidence for TOA.  Demonstrated simple cyst.  No ruptured.  Etiology for her right flank pain not currently clear at this time.  Suspect  MSK.  Recommend trial NSAIDs.  Reviewed return precautions I recommended follow-up with gynecology.  Will discharge home.     After the discussed management above, the patient was determined to be safe for discharge.  The patient was in agreement with this plan and all questions regarding their care were answered.  ED return precautions were discussed and the patient will return to the ED with any significant worsening of condition.      Final Clinical Impressions(s) / ED Diagnoses   Final diagnoses:  Flank pain    ED Discharge Orders  Ordered    naproxen (NAPROSYN) 500 MG tablet  2 times daily     10/01/19 1418           Milagros Loll, MD 10/01/19 [phone]

## 2019-10-01 NOTE — ED Notes (Signed)
US at bedside

## 2019-10-01 NOTE — ED Notes (Signed)
Pt ambulatory to bathroom

## 2019-10-01 NOTE — ED Triage Notes (Signed)
Pt c/o right flank pain since yesterday. Reports sometimes when urinates doesn't feel getting all of it out.

## 2019-10-01 NOTE — Discharge Instructions (Addendum)
I recommend taking Tylenol, as well as the prescribed naproxen for pain control.  If you develop fever, vomiting, worsening pain despite pain medicines, recommend return to ER for recheck.  Otherwise recommend follow-up with your gynecologist as discussed regarding your ovarian cyst.

## 2019-10-03 ENCOUNTER — Telehealth: Payer: Self-pay | Admitting: Obstetrics and Gynecology

## 2019-10-03 NOTE — Telephone Encounter (Signed)
Spoke with patient, advised per Dr. Jertson. Patient verbalizes understanding and is agreeable.   Encounter closed.  

## 2019-10-03 NOTE — Telephone Encounter (Signed)
Please let the patient know that I reviewed her ultrasound images. I agree with alternating the tylenol and anaprox. If her pain increases prior to her visit with me she needs to be seen. There is a small risk of the ovary twisting or rupturing. She should avoid intercourse for now.

## 2019-10-03 NOTE — Telephone Encounter (Signed)
Patient scheduled ER f/u for Tuesday, 10/08/2019. Patient would like to verify medication dosage for pain management.

## 2019-10-03 NOTE — Telephone Encounter (Signed)
Per review of Epic, [facility] ER visit on 10/01/19 for right flank pain. CT and PUS completed, right ovarian cyst, f/u with GYN. Prescribed naproxen bid for pain. Also taking 2 extra strength tylenol mid-day and using a heating pad. Pain 4/10, no change in symptoms since ER visit. Patient is scheduled for OV on 11/3. Patient asking if ok to continue alternating naproxen and  tylenol?   Offered earlier OV with Dr. Talbert Nan, patient declined. Advised ok to alternate with extra strength tylenol q6 hrs prn, continue naproxen and heating pad. If new symptoms develop or symptoms worsen, return call to office, ER if after hours or over weekend. Advised I will review with Dr. Talbert Nan and return call if any additional recommendations. Patient agreeable.   Dr. Talbert Nan -please review.

## 2019-10-07 ENCOUNTER — Other Ambulatory Visit: Payer: Self-pay

## 2019-10-08 ENCOUNTER — Encounter: Payer: Self-pay | Admitting: Obstetrics and Gynecology

## 2019-10-08 ENCOUNTER — Ambulatory Visit (INDEPENDENT_AMBULATORY_CARE_PROVIDER_SITE_OTHER): Payer: Managed Care, Other (non HMO) | Admitting: Obstetrics and Gynecology

## 2019-10-08 VITALS — BP 100/58 | HR 66 | Temp 98.0°F | Ht 68.0 in | Wt 181.0 lb

## 2019-10-08 DIAGNOSIS — N83201 Unspecified ovarian cyst, right side: Secondary | ICD-10-CM

## 2019-10-08 DIAGNOSIS — R1031 Right lower quadrant pain: Secondary | ICD-10-CM | POA: Diagnosis not present

## 2019-10-08 MED ORDER — NAPROXEN SODIUM 550 MG PO TABS: 550.0000 mg | ORAL_TABLET | Freq: Two times a day (BID) | ORAL | 2 refills | Status: DC

## 2019-10-08 NOTE — Progress Notes (Signed)
GYNECOLOGY  VISIT   HPI: 33 y.o.   Single White or Caucasian Not Hispanic or Latino  female   [id number] with Patient's last menstrual period was 09/12/2019 (exact date).   here for   Follow up from ER on 10/27 Ovarian cyst. The patient presented to the ER on 10/01/19 c/o a 1 day h/o of right sided abdominal and flank pain. Work up was significant for a 6 cm simple right ovarian cyst.  Since then her pain has improved, on pain medication (naproxen and tylenol) her baseline pain is a 2/10 in severity, occasional sharp pains. This is down from a 6/10 in severity. Her pain is really more in her right lateral mid abdomen and her right flank.  She does feel bloated. No urinary frequency, urgency or dysuria. No fever.   She is in a long distance relationship. Last sexually active on 09/21/19, only used w/d for contraception.  She has a h/o PCOS, can skip cycles. Declines contraception. No STD concerns.   GYNECOLOGIC HISTORY: Patient's last menstrual period was 09/12/2019 (exact date). Contraception:none  Menopausal hormone therapy: none         OB History    Gravida  0   Para  0   Term  0   Preterm  0   AB  0   Living  0     SAB  0   TAB  0   Ectopic  0   Multiple  0   Live Births  0              Patient Active Problem List   Diagnosis Date Noted  . PCOS (polycystic ovarian syndrome) 01/14/2019    Past Medical History:  Diagnosis Date  . Anxiety   . Infertility, female   . Irregular menstrual cycle   . PCOS (polycystic ovarian syndrome)     No past surgical history on file.  Current Outpatient Medications  Medication Sig Dispense Refill  . acetaminophen (TYLENOL) 325 MG tablet Take 650 mg by mouth every 6 (six) hours as needed for mild pain.    . Multiple Vitamin (MULTIVITAMIN) tablet Take 1 tablet by mouth daily.    . naproxen (NAPROSYN) 500 MG tablet Take 1 tablet (500 mg total) by mouth 2 (two) times daily. 30 tablet 0  . naproxen sodium (ANAPROX DS) 550  MG tablet Take 1 tablet (550 mg total) by mouth 2 (two) times daily with a meal. 30 tablet 2  . amphetamine-dextroamphetamine (ADDERALL XR) 15 MG 24 hr capsule Take 1 capsule by mouth every morning. (Patient not taking: Reported on 10/08/2019) 30 capsule 0  . amphetamine-dextroamphetamine (ADDERALL XR) 15 MG 24 hr capsule Take 1 capsule by mouth every morning. (Patient not taking: Reported on 10/08/2019) 30 capsule 0  . amphetamine-dextroamphetamine (ADDERALL XR) 15 MG 24 hr capsule Take 1 capsule by mouth every morning. (Patient not taking: Reported on 10/08/2019) 30 capsule 0  . amphetamine-dextroamphetamine (ADDERALL) 10 MG tablet Take 1 tablet (10 mg total) by mouth daily at 12 noon. (Patient not taking: Reported on 10/08/2019) 30 tablet 0  . amphetamine-dextroamphetamine (ADDERALL) 10 MG tablet Take 1 tablet (10 mg total) by mouth daily at 12 noon. (Patient not taking: Reported on 10/08/2019) 30 tablet 0  . amphetamine-dextroamphetamine (ADDERALL) 10 MG tablet Take 1 tablet (10 mg total) by mouth daily at 12 noon. (Patient not taking: Reported on 10/08/2019) 30 tablet 0   No current facility-administered medications for this visit.      ALLERGIES:  Patient has no known allergies.  Family History  Problem Relation Age of Onset  . Thyroid disease Mother   . Hypertension Sister   . Thyroid disease Sister   . Stroke Maternal Grandmother   . Heart failure Maternal Grandmother   . Prostate cancer Maternal Grandfather   . Diabetes Maternal Grandfather     Social History   Socioeconomic History  . Marital status: Single    Spouse name: Not on file  . Number of children: Not on file  . Years of education: Not on file  . Highest education level: Not on file  Occupational History  . Not on file  Social Needs  . Financial resource strain: Not on file  . Food insecurity    Worry: Not on file    Inability: Not on file  . Transportation needs    Medical: Not on file    Non-medical: Not on  file  Tobacco Use  . Smoking status: Never Smoker  . Smokeless tobacco: Never Used  Substance and Sexual Activity  . Alcohol use: Yes    Alcohol/week: 3.0 standard drinks    Types: 3 Standard drinks or equivalent per week    Comment:  per week  . Drug use: No  . Sexual activity: Not Currently    Partners: Male    Birth control/protection: None  Lifestyle  . Physical activity    Days per week: Not on file    Minutes per session: Not on file  . Stress: Not on file  Relationships  . Social Musician on phone: Not on file    Gets together: Not on file    Attends religious service: Not on file    Active member of club or organization: Not on file    Attends meetings of clubs or organizations: Not on file    Relationship status: Not on file  . Intimate partner violence    Fear of current or ex partner: Not on file    Emotionally abused: Not on file    Physically abused: Not on file    Forced sexual activity: Not on file  Other Topics Concern  . Not on file  Social History Narrative  . Not on file    Review of Systems  All other systems reviewed and are negative.   PHYSICAL EXAMINATION:    BP (!) 100/58   Pulse 66   Temp 98 F (36.7 C)   Ht 5\' 8"  (1.727 m)   Wt 181 lb (82.1 kg)   LMP 09/12/2019 (Exact Date)   SpO2 98%   BMI 27.52 kg/m     General appearance: alert, cooperative and appears stated age CVA: not tender Abdomen: soft, non-tender; non distended, no masses,  no organomegaly  Pelvic: External genitalia:  no lesions              Urethra:  normal appearing urethra with no masses, tenderness or lesions              Bartholins and Skenes: normal                Cervix: no cervical motion tenderness              Bimanual Exam:  Uterus:  normal size, contour, position, consistency, mobility, non-tender              Adnexa: large tender, tense mass posterior to the uterus and toward the right adnexa, ~8 cm. No left adnexal mass  or tenderness.                 Chaperone was present for exam.  Reviewed ultrasound images with the patient from the ER on 10/01/19  ASSESSMENT Large, tender, simple right ovarian cyst, no signs of torsion on ultrasound last week Abdominal pain    PLAN Anaprox DS script given Tylenol prn F/U in 5 weeks for a repeat ultrasound Call with worsening pain Recommended to avoid intercourse as long as she is hurting and then she should control rate and depth of penetration Discussed risk of ovarian torsion or cyst rupture.    An After Visit Summary was printed and given to the patient.  ~20 minutes face to face time of which over 50% was spent in counseling.

## 2019-10-08 NOTE — Patient Instructions (Signed)
Ovarian Cyst     An ovarian cyst is a fluid-filled sac that forms on an ovary. The ovaries are small organs that produce eggs in women. Various types of cysts can form on the ovaries. Some may cause symptoms and require treatment. Most ovarian cysts go away on their own, are not cancerous (are benign), and do not cause problems. Common types of ovarian cysts include:  Functional (follicle) cysts. ? Occur during the menstrual cycle, and usually go away with the next menstrual cycle if you do not get pregnant. ? Usually cause no symptoms.  Endometriomas. ? Are cysts that form from the tissue that lines the uterus (endometrium). ? Are sometimes called "chocolate cysts" because they become filled with blood that turns brown. ? Can cause pain in the lower abdomen during intercourse and during your period.  Cystadenoma cysts. ? Develop from cells on the outside surface of the ovary. ? Can get very large and cause lower abdomen pain and pain with intercourse. ? Can cause severe pain if they twist or break open (rupture).  Dermoid cysts. ? Are sometimes found in both ovaries. ? May contain different kinds of body tissue, such as skin, teeth, hair, or cartilage. ? Usually do not cause symptoms unless they get very big.  Theca lutein cysts. ? Occur when too much of a certain hormone (human chorionic gonadotropin) is produced and overstimulates the ovaries to produce an egg. ? Are most common after having procedures used to assist with the conception of a baby (in vitro fertilization). What are the causes? Ovarian cysts may be caused by:  Ovarian hyperstimulation syndrome. This is a condition that can develop from taking fertility medicines. It causes multiple large ovarian cysts to form.  Polycystic ovarian syndrome (PCOS). This is a common hormonal disorder that can cause ovarian cysts, as well as problems with your period or fertility. What increases the risk? The following factors may  make you more likely to develop ovarian cysts:  Being overweight or obese.  Taking fertility medicines.  Taking certain forms of hormonal birth control.  Smoking. What are the signs or symptoms? Many ovarian cysts do not cause symptoms. If symptoms are present, they may include:  Pelvic pain or pressure.  Pain in the lower abdomen.  Pain during sex.  Abdominal swelling.  Abnormal menstrual periods.  Increasing pain with menstrual periods. How is this diagnosed? These cysts are commonly found during a routine pelvic exam. You may have tests to find out more about the cyst, such as:  Ultrasound.  X-ray of the pelvis.  CT scan.  MRI.  Blood tests. How is this treated? Many ovarian cysts go away on their own without treatment. Your health care provider may want to check your cyst regularly for 2-3 months to see if it changes. If you are in menopause, it is especially important to have your cyst monitored closely because menopausal women have a higher rate of ovarian cancer. When treatment is needed, it may include:  Medicines to help relieve pain.  A procedure to drain the cyst (aspiration).  Surgery to remove the whole cyst.  Hormone treatment or birth control pills. These methods are sometimes used to help dissolve a cyst. Follow these instructions at home:  Take over-the-counter and prescription medicines only as told by your health care provider.  Do not drive or use heavy machinery while taking prescription pain medicine.  Get regular pelvic exams and Pap tests as often as told by your health care provider.    Return to your normal activities as told by your health care provider. Ask your health care provider what activities are safe for you.  Do not use any products that contain nicotine or tobacco, such as cigarettes and e-cigarettes. If you need help quitting, ask your health care provider.  Keep all follow-up visits as told by your health care provider.  This is important. Contact a health care provider if:  Your periods are late, irregular, or painful, or they stop.  You have pelvic pain that does not go away.  You have pressure on your bladder or trouble emptying your bladder completely.  You have pain during sex.  You have any of the following in your abdomen: ? A feeling of fullness. ? Pressure. ? Discomfort. ? Pain that does not go away. ? Swelling.  You feel generally ill.  You become constipated.  You lose your appetite.  You develop severe acne.  You start to have more body hair and facial hair.  You are gaining weight or losing weight without changing your exercise and eating habits.  You think you may be pregnant. Get help right away if:  You have abdominal pain that is severe or gets worse.  You cannot eat or drink without vomiting.  You suddenly develop a fever.  Your menstrual period is much heavier than usual. This information is not intended to replace advice given to you by your health care provider. Make sure you discuss any questions you have with your health care provider. Document Released: 11/21/2005 Document Revised: 02/19/2018 Document Reviewed: 04/24/2016 Elsevier Patient Education  2020 [employer]  

## 2019-10-09 ENCOUNTER — Telehealth: Payer: Self-pay | Admitting: Obstetrics and Gynecology

## 2019-10-09 NOTE — Telephone Encounter (Signed)
Patient was seen in office on 10/08/19 for ER f/u of right ovarian cyst. Instructions were provided for intercourse.   Dr. Talbert Nan -please review patients MyChart message and advise.

## 2019-10-09 NOTE — Telephone Encounter (Signed)
Patient has a camping trip this weekend that involves a 1.5 mile hike to get to the campsite. She would like to know if this would be ok to do with the 6 cm cyst she has. She would also be carrying a backpack during the hike. Please advise.

## 2019-10-09 NOTE — Telephone Encounter (Signed)
Call placed to patient to review benefit for recommended ultrasound. Left voicemail message requesting a return call °

## 2019-10-09 NOTE — Telephone Encounter (Signed)
Spoke with patient, advised per Dr. Jertson. Patient verbalizes understanding and is agreeable.   Encounter closed.  

## 2019-10-09 NOTE — Telephone Encounter (Signed)
Please advise that I think it is fine for her to go on a camping trip and carry her backpack.

## 2019-10-09 NOTE — Telephone Encounter (Signed)
Patient returned call. Reviewed benefit for recommended five week follow up ultrasound. Patient acknowledges understanding of information presented. Patient is scheduled 11/12/2019 with Dr Talbert Nan. Patient is aware of the appointment date, arrival time and cancellation policy. No further questions. Will close encounter

## 2019-11-07 ENCOUNTER — Other Ambulatory Visit: Payer: Self-pay

## 2019-11-11 NOTE — Progress Notes (Signed)
GYNECOLOGY  VISIT   HPI: 33 y.o.   Single White or Caucasian Not Hispanic or Latino  female   G0P0000 with No LMP recorded.   here for follow up ultrasound. She was diagnosed with a 6 cm simple right ovarian cyst on 10/01/19 when she presented to the ER in pain.  Her pain has improved, no longer daily. Now just with occasional mild sharp pain in her right pelvis. Sexually active, no pain. She hasn't been doing her regular exercise routines yet.   GYNECOLOGIC HISTORY: No LMP recorded. Contraception: None Menopausal hormone therapy: none        OB History    Gravida  0   Para  0   Term  0   Preterm  0   AB  0   Living  0     SAB  0   TAB  0   Ectopic  0   Multiple  0   Live Births  0              Patient Active Problem List   Diagnosis Date Noted  . PCOS (polycystic ovarian syndrome) 01/14/2019    Past Medical History:  Diagnosis Date  . Anxiety   . Infertility, female   . Irregular menstrual cycle   . PCOS (polycystic ovarian syndrome)     History reviewed. No pertinent surgical history.  Current Outpatient Medications  Medication Sig Dispense Refill  . acetaminophen (TYLENOL) 325 MG tablet Take 650 mg by mouth every 6 (six) hours as needed for mild pain.    . Multiple Vitamin (MULTIVITAMIN) tablet Take 1 tablet by mouth daily.    . naproxen (NAPROSYN) 500 MG tablet Take 1 tablet (500 mg total) by mouth 2 (two) times daily. 30 tablet 0  . naproxen sodium (ANAPROX DS) 550 MG tablet Take 1 tablet (550 mg total) by mouth 2 (two) times daily with a meal. 30 tablet 2  . amphetamine-dextroamphetamine (ADDERALL XR) 15 MG 24 hr capsule Take 1 capsule by mouth every morning. (Patient not taking: Reported on 10/08/2019) 30 capsule 0  . amphetamine-dextroamphetamine (ADDERALL XR) 15 MG 24 hr capsule Take 1 capsule by mouth every morning. (Patient not taking: Reported on 10/08/2019) 30 capsule 0  . amphetamine-dextroamphetamine (ADDERALL XR) 15 MG 24 hr capsule Take 1  capsule by mouth every morning. (Patient not taking: Reported on 10/08/2019) 30 capsule 0  . amphetamine-dextroamphetamine (ADDERALL) 10 MG tablet Take 1 tablet (10 mg total) by mouth daily at 12 noon. (Patient not taking: Reported on 10/08/2019) 30 tablet 0  . amphetamine-dextroamphetamine (ADDERALL) 10 MG tablet Take 1 tablet (10 mg total) by mouth daily at 12 noon. (Patient not taking: Reported on 10/08/2019) 30 tablet 0  . amphetamine-dextroamphetamine (ADDERALL) 10 MG tablet Take 1 tablet (10 mg total) by mouth daily at 12 noon. (Patient not taking: Reported on 10/08/2019) 30 tablet 0   No current facility-administered medications for this visit.      ALLERGIES: Patient has no known allergies.  Family History  Problem Relation Age of Onset  . Thyroid disease Mother   . Hypertension Sister   . Thyroid disease Sister   . Stroke Maternal Grandmother   . Heart failure Maternal Grandmother   . Prostate cancer Maternal Grandfather   . Diabetes Maternal Grandfather     Social History   Socioeconomic History  . Marital status: Single    Spouse name: Not on file  . Number of children: Not on file  . Years  of education: Not on file  . Highest education level: Not on file  Occupational History  . Not on file  Social Needs  . Financial resource strain: Not on file  . Food insecurity    Worry: Not on file    Inability: Not on file  . Transportation needs    Medical: Not on file    Non-medical: Not on file  Tobacco Use  . Smoking status: Never Smoker  . Smokeless tobacco: Never Used  Substance and Sexual Activity  . Alcohol use: Yes    Alcohol/week: 3.0 standard drinks    Types: 3 Standard drinks or equivalent per week    Comment:  per week  . Drug use: No  . Sexual activity: Yes    Partners: Male    Birth control/protection: None  Lifestyle  . Physical activity    Days per week: Not on file    Minutes per session: Not on file  . Stress: Not on file  Relationships  .  Social Musician on phone: Not on file    Gets together: Not on file    Attends religious service: Not on file    Active member of club or organization: Not on file    Attends meetings of clubs or organizations: Not on file    Relationship status: Not on file  . Intimate partner violence    Fear of current or ex partner: Not on file    Emotionally abused: Not on file    Physically abused: Not on file    Forced sexual activity: Not on file  Other Topics Concern  . Not on file  Social History Narrative  . Not on file    Review of Systems  Constitutional: Negative.   HENT: Negative.   Eyes: Negative.   Respiratory: Negative.   Cardiovascular: Negative.   Gastrointestinal: Negative.   Genitourinary: Negative.   Musculoskeletal: Negative.   Skin: Negative.   Neurological: Negative.   Endo/Heme/Allergies: Negative.   Psychiatric/Behavioral: Negative.     PHYSICAL EXAMINATION:    BP 122/70 (BP Location: Right Arm, Patient Position: Sitting, Cuff Size: Normal)   Pulse 88   Temp 98 F (36.7 C) (Skin)     General appearance: alert, cooperative and appears stated age Abdomen: soft, minimally tender in the right lower quadrant, no rebound, no guarding; non distended, no masses,  no organomegaly  Ultrasound images reviewed with the patient. Persistent ~6 cm right ovarian cyst. Also with a small layering cyst in the right ovary and a CL in the left ovary.   ASSESSMENT Stable 6 cm simple, right ovarian cyst. Benign appearance  Pelvic pain, much improved, only minimal and intermittent    PLAN We discussed the risk of torsion and rupture.  Will plan f/u ultrasound in 4 months If she has worsening pain (with or without activity) we discussed the option of laparoscopic cystectomy.    An After Visit Summary was printed and given to the patient.

## 2019-11-12 ENCOUNTER — Ambulatory Visit (INDEPENDENT_AMBULATORY_CARE_PROVIDER_SITE_OTHER): Payer: Managed Care, Other (non HMO)

## 2019-11-12 ENCOUNTER — Other Ambulatory Visit: Payer: Self-pay

## 2019-11-12 ENCOUNTER — Encounter: Payer: Self-pay | Admitting: Obstetrics and Gynecology

## 2019-11-12 ENCOUNTER — Ambulatory Visit (INDEPENDENT_AMBULATORY_CARE_PROVIDER_SITE_OTHER): Payer: Managed Care, Other (non HMO) | Admitting: Obstetrics and Gynecology

## 2019-11-12 VITALS — BP 122/70 | HR 88 | Temp 98.0°F | Wt 181.0 lb

## 2019-11-12 DIAGNOSIS — N83201 Unspecified ovarian cyst, right side: Secondary | ICD-10-CM | POA: Diagnosis not present

## 2019-11-12 DIAGNOSIS — R102 Pelvic and perineal pain: Secondary | ICD-10-CM | POA: Diagnosis not present

## 2019-12-17 NOTE — Progress Notes (Deleted)
34 y.o. G0P0000 Single White or Caucasian Not Hispanic or Latino female here for annual exam.      No LMP recorded.          Sexually active: {yes no:314532}  The current method of family planning is {contraception:315051}.    Exercising: {yes no:314532}  {types:19826} Smoker:  {YES [id number]  Health Maintenance: Pap:  12/08/2017 WNL History of abnormal Pap:  no TDaP:  *** Gardasil: ***   reports that she has never smoked. She has never used smokeless tobacco. She reports current alcohol use of about 3.0 standard drinks of alcohol per week. She reports that she does not use drugs.  Past Medical History:  Diagnosis Date  . Anxiety   . Infertility, female   . Irregular menstrual cycle   . PCOS (polycystic ovarian syndrome)     No past surgical history on file.  Current Outpatient Medications  Medication Sig Dispense Refill  . acetaminophen (TYLENOL) 325 MG tablet Take 650 mg by mouth every 6 (six) hours as needed for mild pain.    Marland Kitchen amphetamine-dextroamphetamine (ADDERALL XR) 15 MG 24 hr capsule Take 1 capsule by mouth every morning. (Patient not taking: Reported on 10/08/2019) 30 capsule 0  . amphetamine-dextroamphetamine (ADDERALL XR) 15 MG 24 hr capsule Take 1 capsule by mouth every morning. (Patient not taking: Reported on 10/08/2019) 30 capsule 0  . amphetamine-dextroamphetamine (ADDERALL XR) 15 MG 24 hr capsule Take 1 capsule by mouth every morning. (Patient not taking: Reported on 10/08/2019) 30 capsule 0  . amphetamine-dextroamphetamine (ADDERALL) 10 MG tablet Take 1 tablet (10 mg total) by mouth daily at 12 noon. (Patient not taking: Reported on 10/08/2019) 30 tablet 0  . amphetamine-dextroamphetamine (ADDERALL) 10 MG tablet Take 1 tablet (10 mg total) by mouth daily at 12 noon. (Patient not taking: Reported on 10/08/2019) 30 tablet 0  . amphetamine-dextroamphetamine (ADDERALL) 10 MG tablet Take 1 tablet (10 mg total) by mouth daily at 12 noon. (Patient not taking: Reported on  10/08/2019) 30 tablet 0  . Multiple Vitamin (MULTIVITAMIN) tablet Take 1 tablet by mouth daily.    . naproxen (NAPROSYN) 500 MG tablet Take 1 tablet (500 mg total) by mouth 2 (two) times daily. 30 tablet 0  . naproxen sodium (ANAPROX DS) 550 MG tablet Take 1 tablet (550 mg total) by mouth 2 (two) times daily with a meal. 30 tablet 2   No current facility-administered medications for this visit.    Family History  Problem Relation Age of Onset  . Thyroid disease Mother   . Hypertension Sister   . Thyroid disease Sister   . Stroke Maternal Grandmother   . Heart failure Maternal Grandmother   . Prostate cancer Maternal Grandfather   . Diabetes Maternal Grandfather     Review of Systems  Exam:   There were no vitals taken for this visit.  Weight change: @WEIGHTCHANGE @ Height:      Ht Readings from Last 3 Encounters:  10/08/19 5\' 8"  (1.727 m)  12/13/18 5\' 8"  (1.727 m)  12/08/17 5' 8.25" (1.734 m)    General appearance: alert, cooperative and appears stated age Head: Normocephalic, without obvious abnormality, atraumatic Neck: no adenopathy, supple, symmetrical, trachea midline and thyroid {CHL AMB PHY EX THYROID NORM DEFAULT:[phone]::"normal to inspection and palpation"} Lungs: clear to auscultation bilaterally Cardiovascular: regular rate and rhythm Breasts: {Exam; breast:13139::"normal appearance, no masses or tenderness"} Abdomen: soft, non-tender; non distended,  no masses,  no organomegaly Extremities: extremities normal, atraumatic, no cyanosis or edema Skin: Skin color,  texture, turgor normal. No rashes or lesions Lymph nodes: Cervical, supraclavicular, and axillary nodes normal. No abnormal inguinal nodes palpated Neurologic: Grossly normal   Pelvic: External genitalia:  no lesions              Urethra:  normal appearing urethra with no masses, tenderness or lesions              Bartholins and Skenes: normal                 Vagina: normal appearing vagina with  normal color and discharge, no lesions              Cervix: {CHL AMB PHY EX CERVIX NORM DEFAULT:[phone]::"no lesions"}               Bimanual Exam:  Uterus:  {CHL AMB PHY EX UTERUS NORM [phone]::"normal size, contour, position, consistency, mobility, non-tender"}              Adnexa: {CHL AMB PHY EX ADNEXA NO MASS [phone]::"no mass, fullness, tenderness"}               Rectovaginal: Confirms               Anus:  normal sphincter tone, no lesions  *** chaperoned for the exam.  A:  Well Woman with normal exam  P:

## 2019-12-19 ENCOUNTER — Telehealth: Payer: Self-pay | Admitting: Obstetrics and Gynecology

## 2019-12-19 NOTE — Telephone Encounter (Signed)
Call placed to patient to review benefit for scheduled ultrasound appointment on 12/24/2019. Left voicemail message requesting a return call.

## 2019-12-24 ENCOUNTER — Other Ambulatory Visit: Payer: Self-pay | Admitting: Obstetrics and Gynecology

## 2019-12-24 ENCOUNTER — Other Ambulatory Visit: Payer: Managed Care, Other (non HMO)

## 2019-12-25 ENCOUNTER — Other Ambulatory Visit: Payer: Self-pay

## 2019-12-25 ENCOUNTER — Ambulatory Visit: Payer: Self-pay | Admitting: Obstetrics and Gynecology

## 2019-12-25 ENCOUNTER — Ambulatory Visit (INDEPENDENT_AMBULATORY_CARE_PROVIDER_SITE_OTHER): Payer: Managed Care, Other (non HMO) | Admitting: Obstetrics and Gynecology

## 2019-12-25 ENCOUNTER — Encounter: Payer: Self-pay | Admitting: Obstetrics and Gynecology

## 2019-12-25 VITALS — BP 110/60 | HR 92 | Temp 98.9°F | Ht 68.0 in | Wt 186.0 lb

## 2019-12-25 DIAGNOSIS — Z113 Encounter for screening for infections with a predominantly sexual mode of transmission: Secondary | ICD-10-CM | POA: Diagnosis not present

## 2019-12-25 DIAGNOSIS — Z01419 Encounter for gynecological examination (general) (routine) without abnormal findings: Secondary | ICD-10-CM

## 2019-12-25 DIAGNOSIS — N83201 Unspecified ovarian cyst, right side: Secondary | ICD-10-CM

## 2019-12-25 NOTE — Progress Notes (Signed)
34 y.o. G0P0000 Single White or Caucasian Not Hispanic or Latino female here for annual exam.  Not sexually active since November, her boyfriend is in [place]. She is planning to move back to [place] and probably get married next summer.    She was seen in the ER in 10/20 with pain and diagnosed with a 6 cm, simple right ovarian cyst. Her pain resolved, f/u ultrasound last month with persistent 6 cm right ovarian cyst. We discussed the risk of torsion and rupture. Discussed option of surgery or observation. Ultrasound planned in 4/21.   Cycles range from every 4-6 weeks, occasional 8 weeks.  Period Cycle (Days): 35 Period Duration (Days): 4 days Period Pattern: (!) Irregular Menstrual Flow: Moderate Menstrual Control: Panty liner, Other (Comment)(Diva cup) Menstrual Control Change Freq (Hours): 2-3 hours on heavy days Dysmenorrhea: None  Last menstrual period is1/19/21         Sexually active: No. Not currently The current method of family planning is none.    Exercising: Yes.    Fitness gym Cardio and light weight lifting  Smoker:  no  Health Maintenance: Pap:  12/08/17 WNL  History of abnormal Pap:  no TDaP:  Patient is unsure  Gardasil: none   reports that she has never smoked. She has never used smokeless tobacco. She reports current alcohol use of about 3.0 standard drinks of alcohol per week. She reports that she does not use drugs. 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   Past Medical History:  Diagnosis Date  . Anxiety   . Infertility, female   . Irregular menstrual cycle   . PCOS (polycystic ovarian syndrome)     History reviewed. No pertinent surgical history.  Current Outpatient Medications  Medication Sig Dispense Refill  . acetaminophen (TYLENOL) 325 MG tablet Take 650 mg by mouth every 6 (six) hours as needed for mild pain.    Marland Kitchen amphetamine-dextroamphetamine (ADDERALL XR) 15 MG 24 hr capsule Take 1 capsule by mouth every  morning. 30 capsule 0  . amphetamine-dextroamphetamine (ADDERALL) 10 MG tablet Take 1 tablet (10 mg total) by mouth daily at 12 noon. 30 tablet 0  . Multiple Vitamin (MULTIVITAMIN) tablet Take 1 tablet by mouth daily.    . naproxen (NAPROSYN) 500 MG tablet Take 1 tablet (500 mg total) by mouth 2 (two) times daily. 30 tablet 0  . naproxen sodium (ANAPROX DS) 550 MG tablet Take 1 tablet (550 mg total) by mouth 2 (two) times daily with a meal. 30 tablet 2   No current facility-administered medications for this visit.    Family History  Problem Relation Age of Onset  . Thyroid disease Mother   . Hypertension Sister   . Thyroid disease Sister   . Stroke Maternal Grandmother   . Heart failure Maternal Grandmother   . Prostate cancer Maternal Grandfather   . Diabetes Maternal Grandfather   Father died in August 24, 2023, suspected MI. He was 72. She is doing okay.   Review of Systems  All other systems reviewed and are negative.   Exam:   BP 110/60   Pulse 92   Temp 98.9 F (37.2 C)   Ht 5\' 8"  (1.727 m)   Wt 186 lb (84.4 kg)   LMP 12/24/2019   SpO2 96%   BMI 28.28 kg/m   Weight change: @WEIGHTCHANGE @ Height:   Height: 5\' 8"  (172.7 cm)  Ht Readings from Last 3 Encounters:  12/25/19 5\' 8"  (1.727 m)  10/08/19 5\' 8"  (1.727 m)  12/13/18 5\' 8"  (1.727 m)    General appearance: alert, cooperative and appears stated age Head: Normocephalic, without obvious abnormality, atraumatic Neck: no adenopathy, supple, symmetrical, trachea midline and thyroid normal to inspection and palpation Lungs: clear to auscultation bilaterally Cardiovascular: regular rate and rhythm Breasts: normal appearance, no masses or tenderness Abdomen: soft, non-tender; non distended,  no masses,  no organomegaly Extremities: extremities normal, atraumatic, no cyanosis or edema Skin: Skin color, texture, turgor normal. No rashes or lesions Lymph nodes: Cervical, supraclavicular, and axillary nodes normal. No abnormal  inguinal nodes palpated Neurologic: Grossly normal   Pelvic: External genitalia:  no lesions              Urethra:  normal appearing urethra with no masses, tenderness or lesions              Bartholins and Skenes: normal                 Vagina: normal appearing vagina with normal color and discharge, no lesions              Cervix: no lesions               Bimanual Exam:  Uterus:  normal size, contour, position, consistency, mobility, non-tender              Adnexa: no masses, slight fullness on the right, not tender               Rectovaginal: Large, cystic feeling mass in the right adnexa, ~7 cm, not tender               Anus:  normal sphincter tone, no lesions  Gae Dry chaperoned for the exam.  A:  Well Woman with normal exam  Large simple right ovarian cyst, still felt on RV exam, not symptomatic  P:   Pap next year  STD testing  Labs UTD (will check next year)  Discussed breast self exam  Discussed calcium and vit D intake  F/U ultrasound in 4/20. She is aware of risk of torsion or rupture. If it is stable on ultrasound, will recommend f/u ultrasound in 6 months.

## 2019-12-25 NOTE — Patient Instructions (Addendum)
Counselor: Chele Fleming, [phone], [phone] ° °EXERCISE AND DIET:  We recommended that you start or continue a regular exercise program for good health. Regular exercise means any activity that makes your heart beat faster and makes you sweat.  We recommend exercising at least 30 minutes per day at least 3 days a week, preferably 4 or 5.  We also recommend a diet low in fat and sugar.  Inactivity, poor dietary choices and obesity can cause diabetes, heart attack, stroke, and kidney damage, among others.   ° °ALCOHOL AND SMOKING:  Women should limit their alcohol intake to no more than 7 drinks/beers/glasses of wine (combined, not each!) per week. Moderation of alcohol intake to this level decreases your risk of breast cancer and liver damage. And of course, no recreational drugs are part of a healthy lifestyle.  And absolutely no smoking or even second hand smoke. Most people know smoking can cause heart and lung diseases, but did you know it also contributes to weakening of your bones? Aging of your skin?  Yellowing of your teeth and nails? ° °CALCIUM AND VITAMIN D:  Adequate intake of calcium and Vitamin D are recommended.  The recommendations for exact amounts of these supplements seem to change often, but generally speaking 1,000 mg of calcium (between diet and supplement) and 800 units of Vitamin D per day seems prudent. Certain women may benefit from higher intake of Vitamin D.  If you are among these women, your doctor will have told you during your visit.   ° °PAP SMEARS:  Pap smears, to check for cervical cancer or precancers,  have traditionally been done yearly, although recent scientific advances have shown that most women can have pap smears less often.  However, every woman still should have a physical exam from her gynecologist every year. It will include a breast check, inspection of the vulva and vagina to check for abnormal growths or skin changes, a visual exam of the cervix, and then an  exam to evaluate the size and shape of the uterus and ovaries.  And after 32 years of age, a rectal exam is indicated to check for rectal cancers. We will also provide age appropriate advice regarding health maintenance, like when you should have certain vaccines, screening for sexually transmitted diseases, bone density testing, colonoscopy, mammograms, etc.  ° °MAMMOGRAMS:  All women over 40 years old should have a yearly mammogram. Many facilities now offer a "3D" mammogram, which may cost around $50 extra out of pocket. If possible,  we recommend you accept the option to have the 3D mammogram performed.  It both reduces the number of women who will be called back for extra views which then turn out to be normal, and it is better than the routine mammogram at detecting truly abnormal areas.   ° °COLON CANCER SCREENING: Now recommend starting at age 45. At this time colonoscopy is not covered for routine screening until 50. There are take home tests that can be done between 45-49.  ° °COLONOSCOPY:  Colonoscopy to screen for colon cancer is recommended for all women at age 50.  We know, you hate the idea of the prep.  We agree, BUT, having colon cancer and not knowing it is worse!!  Colon cancer so often starts as a polyp that can be seen and removed at colonscopy, which can quite literally save your life!  And if your first colonoscopy is normal and you have no family history of colon cancer, most women don't   have to have it again for 10 years.  Once every ten years, you can do something that may end up saving your life, right?  We will be happy to help you get it scheduled when you are ready.  Be sure to check your insurance coverage so you understand how much it will cost.  It may be covered as a preventative service at no cost, but you should check your particular policy.   ° ° ° °Breast Self-Awareness °Breast self-awareness means being familiar with how your breasts look and feel. It involves checking your  breasts regularly and reporting any changes to your health care provider. °Practicing breast self-awareness is important. A change in your breasts can be a sign of a serious medical problem. Being familiar with how your breasts look and feel allows you to find any problems early, when treatment is more likely to be successful. All women should practice breast self-awareness, including women who have had breast implants. °How to do a breast self-exam °One way to learn what is normal for your breasts and whether your breasts are changing is to do a breast self-exam. To do a breast self-exam: °Look for Changes ° °1. Remove all the clothing above your waist. °2. Stand in front of a mirror in a room with good lighting. °3. Put your hands on your hips. °4. Push your hands firmly downward. °5. Compare your breasts in the mirror. Look for differences between them (asymmetry), such as: °? Differences in shape. °? Differences in size. °? Puckers, dips, and bumps in one breast and not the other. °6. Look at each breast for changes in your skin, such as: °? Redness. °? Scaly areas. °7. Look for changes in your nipples, such as: °? Discharge. °? Bleeding. °? Dimpling. °? Redness. °? A change in position. °Feel for Changes °Carefully feel your breasts for lumps and changes. It is best to do this while lying on your back on the floor and again while sitting or standing in the shower or tub with soapy water on your skin. Feel each breast in the following way: °· Place the arm on the side of the breast you are examining above your head. °· Feel your breast with the other hand. °· Start in the nipple area and make ¾ inch (2 cm) overlapping circles to feel your breast. Use the pads of your three middle fingers to do this. Apply light pressure, then medium pressure, then firm pressure. The light pressure will allow you to feel the tissue closest to the skin. The medium pressure will allow you to feel the tissue that is a little deeper.  The firm pressure will allow you to feel the tissue close to the ribs. °· Continue the overlapping circles, moving downward over the breast until you feel your ribs below your breast. °· Move one finger-width toward the center of the body. Continue to use the ¾ inch (2 cm) overlapping circles to feel your breast as you move slowly up toward your collarbone. °· Continue the up and down exam using all three pressures until you reach your armpit. ° °Write Down What You Find ° °Write down what is normal for each breast and any changes that you find. Keep a written record with breast changes or normal findings for each breast. By writing this information down, you do not need to depend only on memory for size, tenderness, or location. Write down where you are in your menstrual cycle, if you are still menstruating. °If   you are having trouble noticing differences in your breasts, do not get discouraged. With time you will become more familiar with the variations in your breasts and more comfortable with the exam. °How often should I examine my breasts? °Examine your breasts every month. If you are breastfeeding, the best time to examine your breasts is after a feeding or after using a breast pump. If you menstruate, the best time to examine your breasts is 5-7 days after your period is over. During your period, your breasts are lumpier, and it may be more difficult to notice changes. °When should I see my health care provider? °See your health care provider if you notice: °· A change in shape or size of your breasts or nipples. °· A change in the skin of your breast or nipples, such as a reddened or scaly area. °· Unusual discharge from your nipples. °· A lump or thick area that was not there before. °· Pain in your breasts. °· Anything that concerns you. ° °

## 2019-12-26 LAB — HIV ANTIBODY (ROUTINE TESTING W REFLEX): HIV Screen 4th Generation wRfx: NONREACTIVE

## 2019-12-26 LAB — RPR: RPR Ser Ql: NONREACTIVE

## 2019-12-27 LAB — CHLAMYDIA/GONOCOCCUS/TRICHOMONAS, NAA
Chlamydia by NAA: NEGATIVE
Gonococcus by NAA: NEGATIVE
Trich vag by NAA: NEGATIVE

## 2020-03-10 ENCOUNTER — Other Ambulatory Visit: Payer: Managed Care, Other (non HMO)

## 2020-03-10 ENCOUNTER — Other Ambulatory Visit: Payer: Self-pay

## 2020-03-10 ENCOUNTER — Other Ambulatory Visit: Payer: Self-pay | Admitting: Obstetrics and Gynecology

## 2020-03-24 ENCOUNTER — Encounter: Payer: Self-pay | Admitting: Obstetrics and Gynecology

## 2020-03-24 ENCOUNTER — Ambulatory Visit (INDEPENDENT_AMBULATORY_CARE_PROVIDER_SITE_OTHER): Payer: Managed Care, Other (non HMO) | Admitting: Obstetrics and Gynecology

## 2020-03-24 ENCOUNTER — Other Ambulatory Visit: Payer: Self-pay

## 2020-03-24 ENCOUNTER — Ambulatory Visit (INDEPENDENT_AMBULATORY_CARE_PROVIDER_SITE_OTHER): Payer: Managed Care, Other (non HMO)

## 2020-03-24 VITALS — BP 122/64 | HR 77 | Temp 98.7°F | Ht 68.0 in | Wt 177.6 lb

## 2020-03-24 DIAGNOSIS — N83201 Unspecified ovarian cyst, right side: Secondary | ICD-10-CM | POA: Diagnosis not present

## 2020-03-24 DIAGNOSIS — R102 Pelvic and perineal pain: Secondary | ICD-10-CM | POA: Diagnosis not present

## 2020-03-24 NOTE — Progress Notes (Signed)
GYNECOLOGY  VISIT   HPI: 34 y.o.   Single White or Caucasian Not Hispanic or Latino  female   [id number] with Patient's last menstrual period was 02/24/2020.   here for a follow up pelvic ultrasound. The patient was seen in the ER in 10/20 with pelvic pain and was diagnosed with a 6 cm simple right ovarian cyst. It persisted on follow up ultrasound in 12/20. We discussed the option of observation or surgery. She is aware of the risk of torsion and rupture.   She is without pain, not currently sexually active. She is getting married in 8/21 and is moving to [place].   GYNECOLOGIC HISTORY: Patient's last menstrual period was 02/24/2020. Contraception:none  Menopausal hormone therapy: none         OB History    Gravida  0   Para  0   Term  0   Preterm  0   AB  0   Living  0     SAB  0   TAB  0   Ectopic  0   Multiple  0   Live Births  0              Patient Active Problem List   Diagnosis Date Noted  . PCOS (polycystic ovarian syndrome) 01/14/2019    Past Medical History:  Diagnosis Date  . Anxiety   . Infertility, female   . Irregular menstrual cycle   . PCOS (polycystic ovarian syndrome)     History reviewed. No pertinent surgical history.  Current Outpatient Medications  Medication Sig Dispense Refill  . Multiple Vitamin (MULTIVITAMIN) tablet Take 1 tablet by mouth daily.    Marland Kitchen acetaminophen (TYLENOL) 325 MG tablet Take 650 mg by mouth every 6 (six) hours as needed for mild pain.    Marland Kitchen amphetamine-dextroamphetamine (ADDERALL) 10 MG tablet Take 1 tablet (10 mg total) by mouth daily at 12 noon. 30 tablet 0  . naproxen (NAPROSYN) 500 MG tablet Take 1 tablet (500 mg total) by mouth 2 (two) times daily. 30 tablet 0  . naproxen sodium (ANAPROX DS) 550 MG tablet Take 1 tablet (550 mg total) by mouth 2 (two) times daily with a meal. 30 tablet 2   No current facility-administered medications for this visit.     ALLERGIES: Patient has no known  allergies.  Family History  Problem Relation Age of Onset  . Thyroid disease Mother   . Hypertension Sister   . Thyroid disease Sister   . Stroke Maternal Grandmother   . Heart failure Maternal Grandmother   . Prostate cancer Maternal Grandfather   . Diabetes Maternal Grandfather     Social History   Socioeconomic History  . Marital status: Single    Spouse name: Not on file  . Number of children: Not on file  . Years of education: Not on file  . Highest education level: Not on file  Occupational History  . Not on file  Tobacco Use  . Smoking status: Never Smoker  . Smokeless tobacco: Never Used  Substance and Sexual Activity  . Alcohol use: Yes    Alcohol/week: 3.0 standard drinks    Types: 3 Standard drinks or equivalent per week    Comment:  per week  . Drug use: No  . Sexual activity: Yes    Partners: Male    Birth control/protection: None  Other Topics Concern  . Not on file  Social History Narrative  . Not on file   Social Determinants of  Health   Financial Resource Strain:   . Difficulty of Paying Living Expenses:   Food Insecurity:   . Worried About Programme researcher, broadcasting/film/video in the Last Year:   . Barista in the Last Year:   Transportation Needs:   . Freight forwarder (Medical):   Marland Kitchen Lack of Transportation (Non-Medical):   Physical Activity:   . Days of Exercise per Week:   . Minutes of Exercise per Session:   Stress:   . Feeling of Stress :   Social Connections:   . Frequency of Communication with Friends and Family:   . Frequency of Social Gatherings with Friends and Family:   . Attends Religious Services:   . Active Member of Clubs or Organizations:   . Attends Banker Meetings:   Marland Kitchen Marital Status:   Intimate Partner Violence:   . Fear of Current or Ex-Partner:   . Emotionally Abused:   Marland Kitchen Physically Abused:   . Sexually Abused:     Review of Systems  All other systems reviewed and are negative.   PHYSICAL  EXAMINATION:    BP 122/64   Pulse 77   Temp 98.7 F (37.1 C)   Ht 5\' 8"  (1.727 m)   Wt 177 lb 9.6 oz (80.6 kg)   LMP 02/24/2020   SpO2 97%   BMI 27.00 kg/m     General appearance: alert, cooperative and appears stated age  Chaperone was present for exam.  Ultrasound images reviewed with the patient Right ovarian cyst currently measures 6.64 x 5.3 cm Ultrasound on 11/12/19 the right ovarian cyst measured 6.27 x 5.19 cm Ultrasound from 10/01/19 the right ovarian cyst measured 6.1 x 5.9 x 4.9 cm  ASSESSMENT Stable, simple right ovarian cyst. She is aware of the risk of torsion and rupture    PLAN Recommend a f/u ultrasound in 6 months in 10/03/19 (will send records once she establishes care)

## 2020-05-24 IMAGING — US US PELVIS COMPLETE
1 series · 13 of 25 positions shown · non-contrast
Comparison: CT abdomen pelvis from same day.

CLINICAL DATA: Right lower quadrant pain.

EXAM:
TRANSABDOMINAL AND TRANSVAGINAL ULTRASOUND OF PELVIS
DOPPLER ULTRASOUND OF OVARIES
TECHNIQUE: Both transabdominal and transvaginal ultrasound examinations of the
pelvis were performed. Transabdominal technique was performed for
global imaging of the pelvis including uterus, ovaries, adnexal
regions, and pelvic cul-de-sac.
It was necessary to proceed with endovaginal exam following the
transabdominal exam to visualize the endometrium and ovaries. Color
and duplex Doppler ultrasound was utilized to evaluate blood flow to
the ovaries.

[Series 1: us pelvis complete · 13 of 153 slices shown]
[im 1/153]
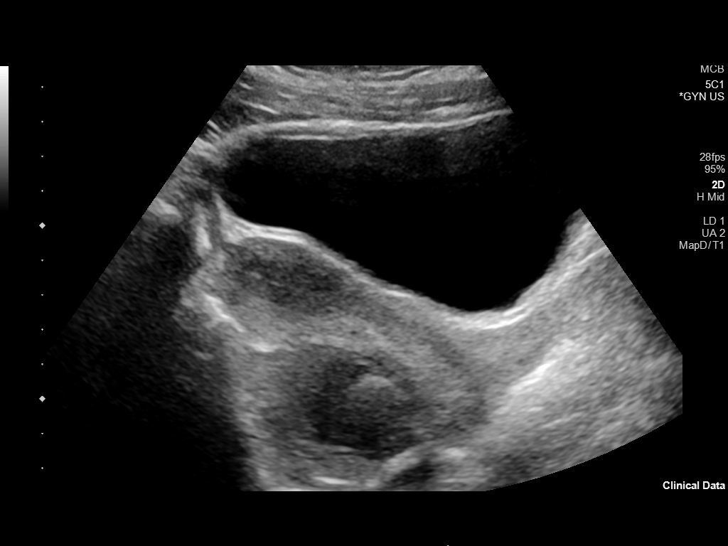
[im 13/153]
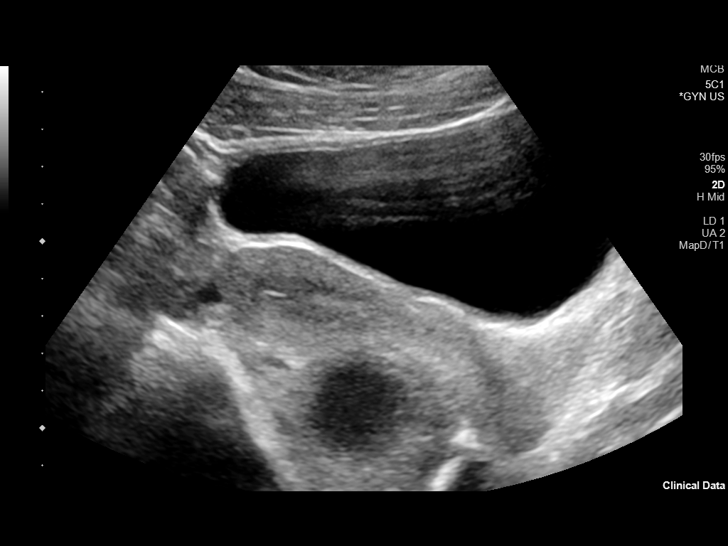
[im 26/153]
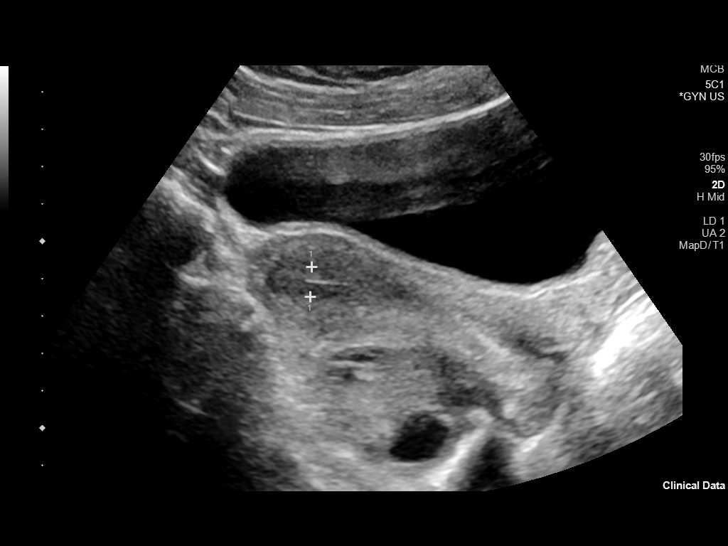
[im 39/153]
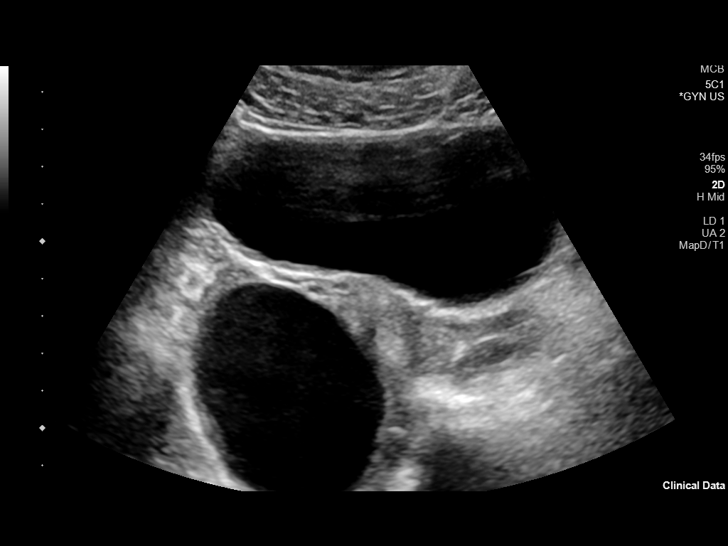
[im 51/153]
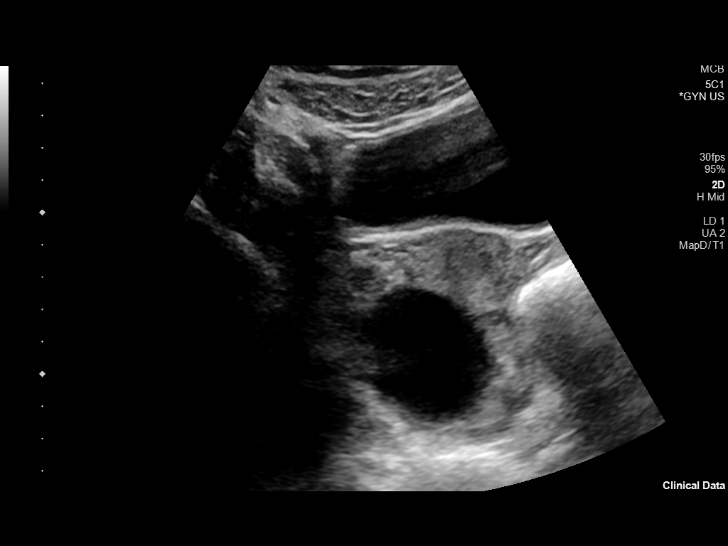
[im 64/153]
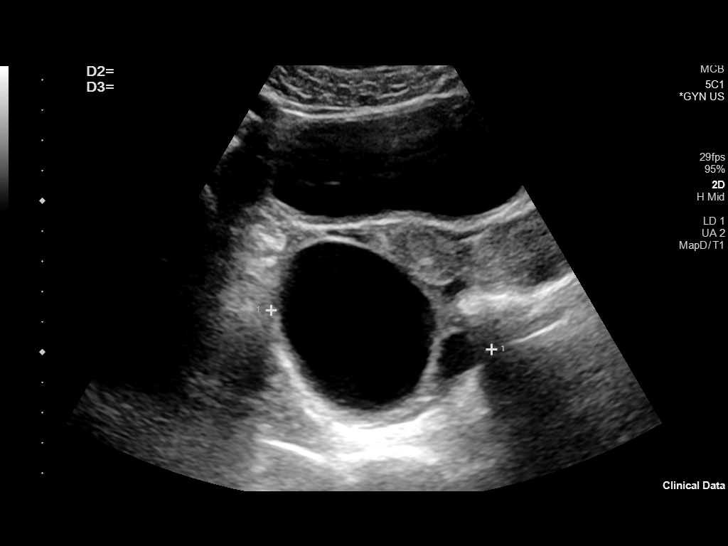
[im 77/153]
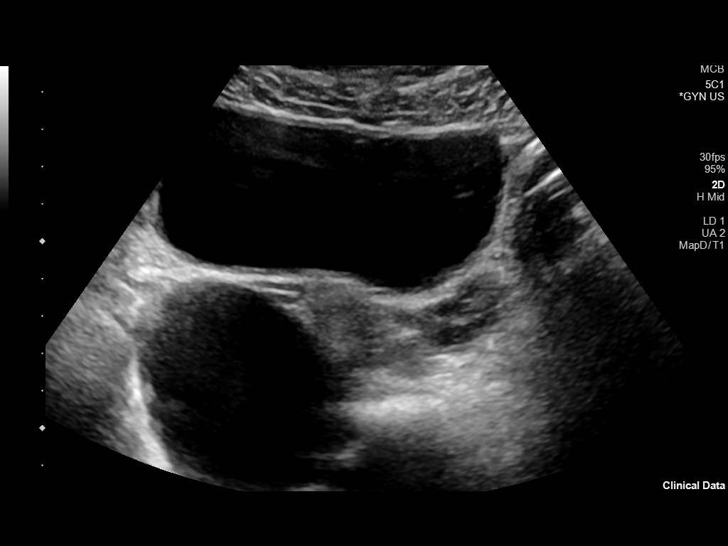
[im 89/153]
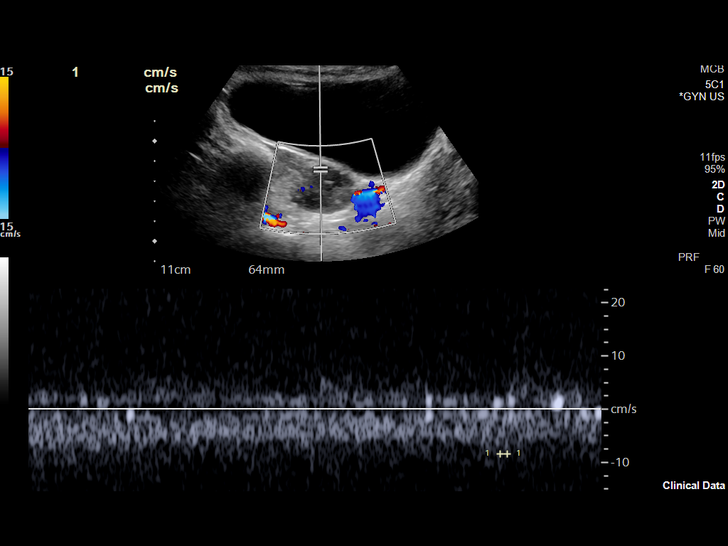
[im 102/153]
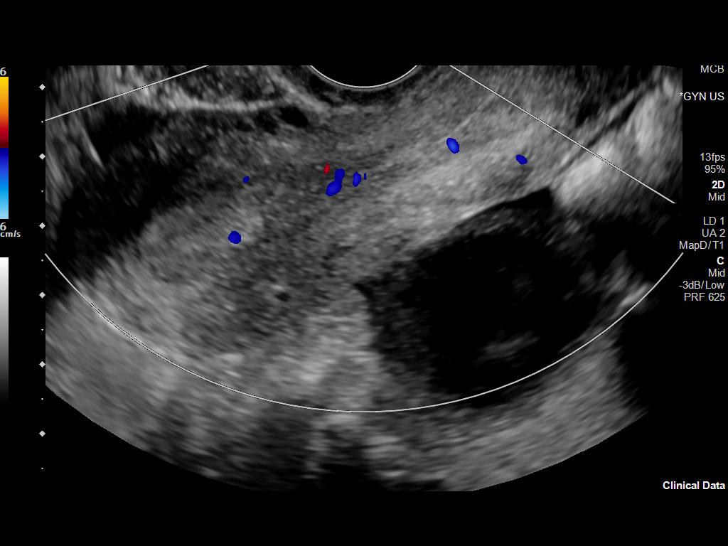
[im 115/153]
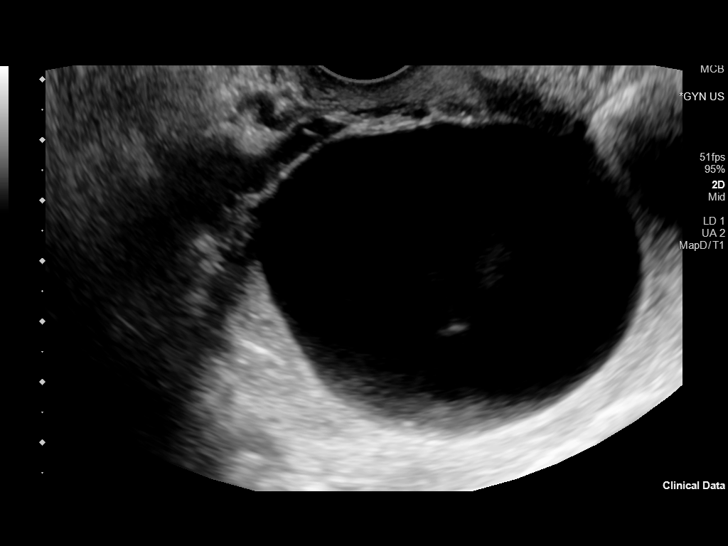
[im 127/153]
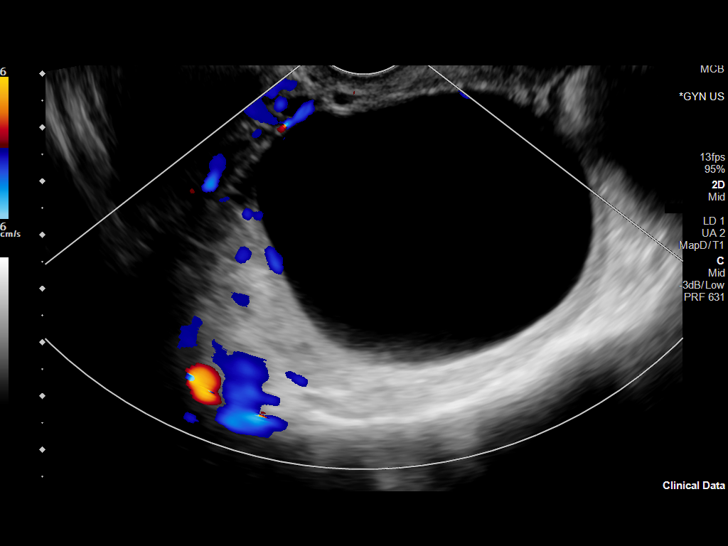
[im 140/153]
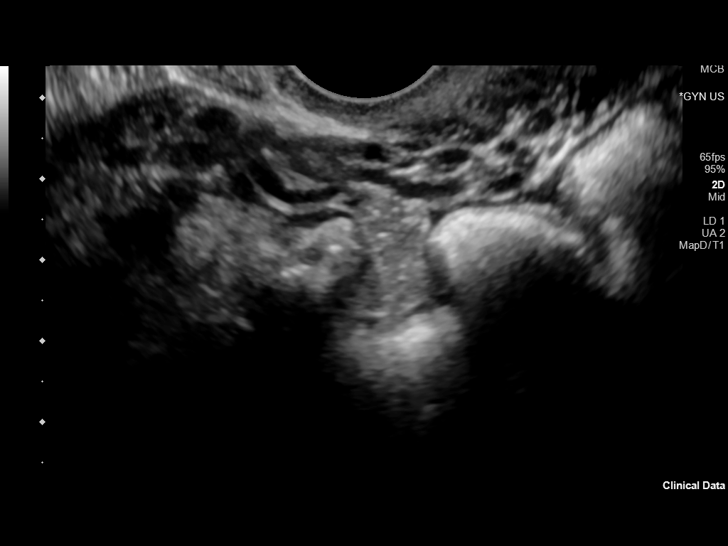
[im 153/153]
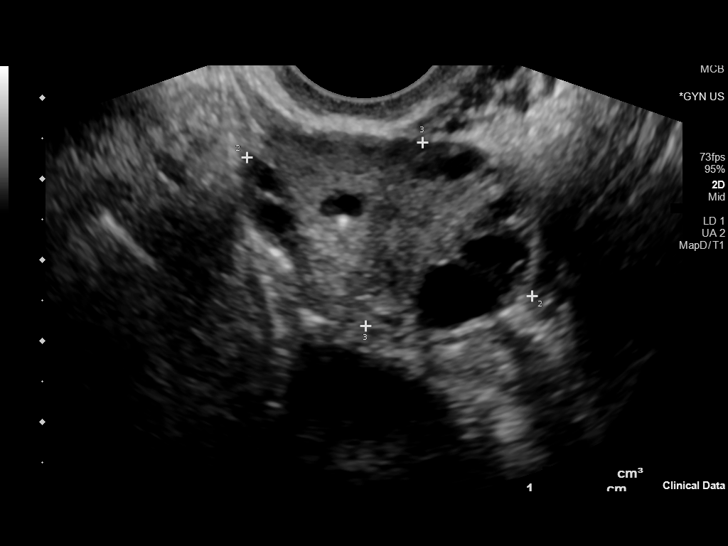

[13 of 25 positions shown; findings below may reference images not displayed]

FINDINGS: Uterus

Measurements: 9.4 x 3.0 x 5.0 cm = volume: 74 mL. No fibroids or
other mass visualized.

Endometrium

Thickness: 8 mm.  No focal abnormality visualized.

Right ovary

Measurements: 7.6 x 6.1 x 7.9 cm = volume: 188 mL. 6.1 x 5.9 x
cm simple cyst.

Left ovary

Measurements: 3.9 x 2.4 x 3.3 cm = volume: 16 mL. Normal
appearance/no adnexal mass.

Pulsed Doppler evaluation of both ovaries demonstrates normal
low-resistance arterial and venous waveforms.

Other findings

Trace free fluid in the pelvis, likely physiologic.
IMPRESSION: 1. No acute abnormality. No sonographic evidence of ovarian torsion.
2. 6.1 cm simple cyst in the right ovary. This has benign
characteristics. No imaging follow up is required for premenopausal
females. This follows consensus guidelines: Simple Adnexal Cysts:
SRU Consensus Conference Update on Follow-up and Reporting.

## 2020-05-24 IMAGING — US US TRANSVAGINAL NON-OB
1 series · 13 of 25 positions shown · non-contrast
Comparison: CT abdomen pelvis from same day.

CLINICAL DATA: Right lower quadrant pain.

EXAM:
TRANSABDOMINAL AND TRANSVAGINAL ULTRASOUND OF PELVIS
DOPPLER ULTRASOUND OF OVARIES
TECHNIQUE: Both transabdominal and transvaginal ultrasound examinations of the
pelvis were performed. Transabdominal technique was performed for
global imaging of the pelvis including uterus, ovaries, adnexal
regions, and pelvic cul-de-sac.
It was necessary to proceed with endovaginal exam following the
transabdominal exam to visualize the endometrium and ovaries. Color
and duplex Doppler ultrasound was utilized to evaluate blood flow to
the ovaries.

[Series 1: us transvaginal non-ob · 13 of 153 slices shown]
[im 1/153]
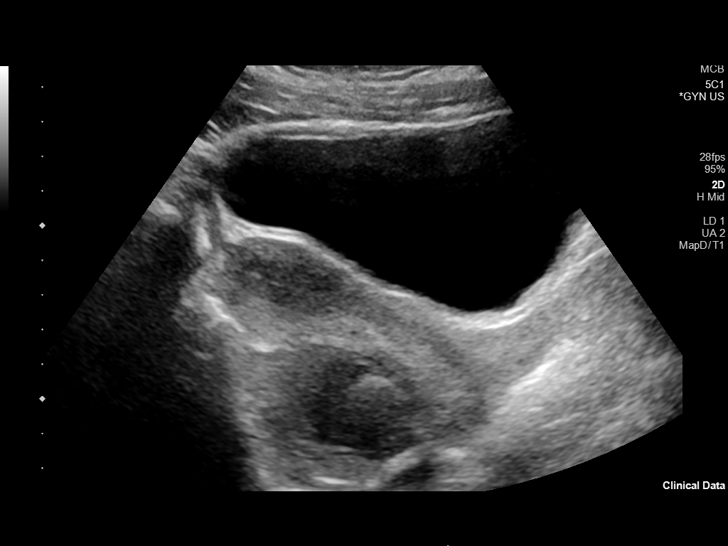
[im 13/153]
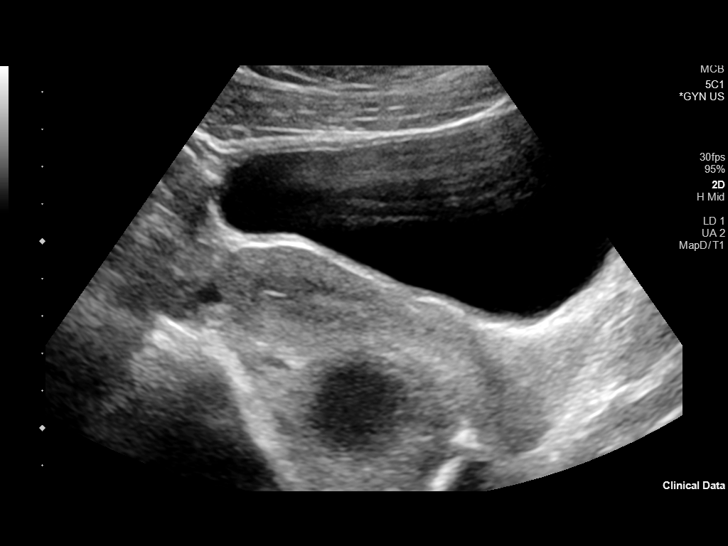
[im 26/153]
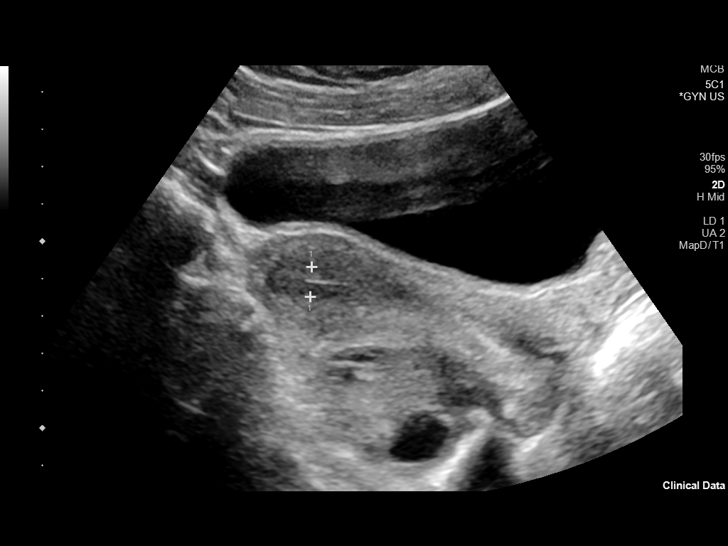
[im 39/153]
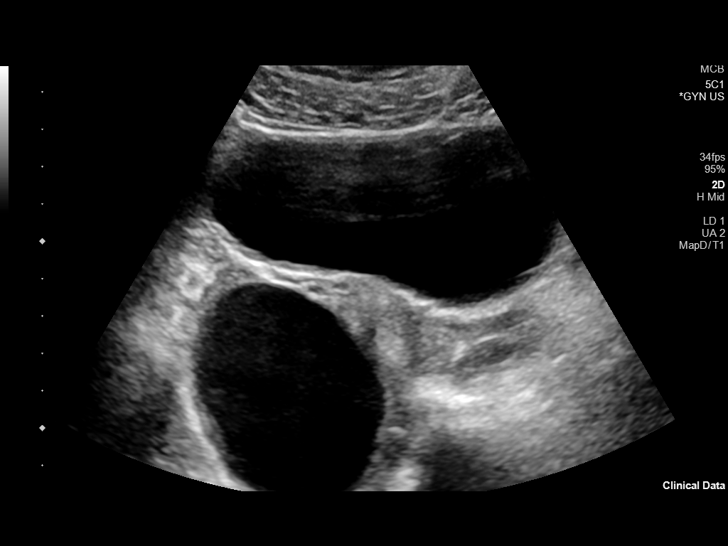
[im 51/153]
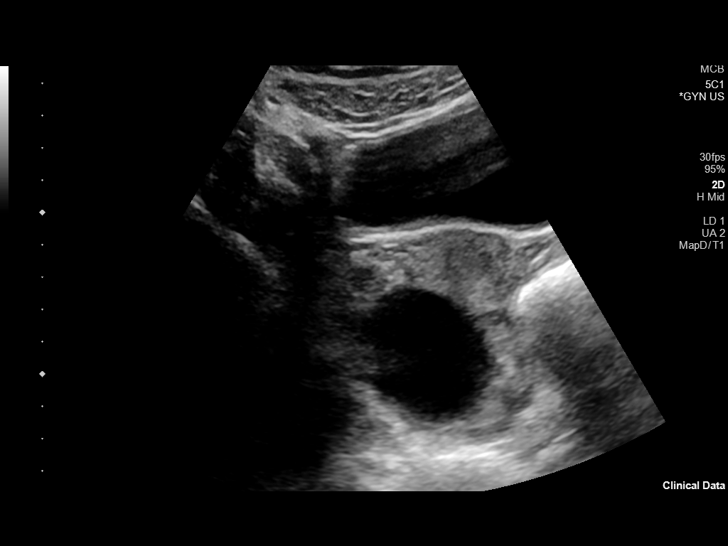
[im 64/153]
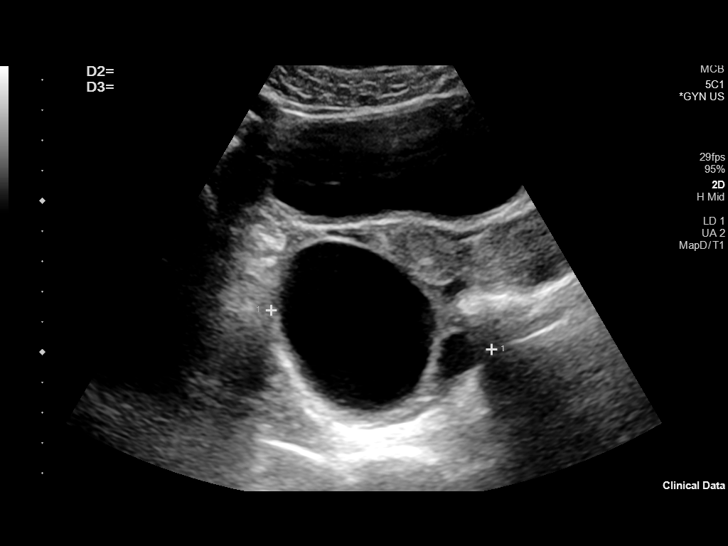
[im 77/153]
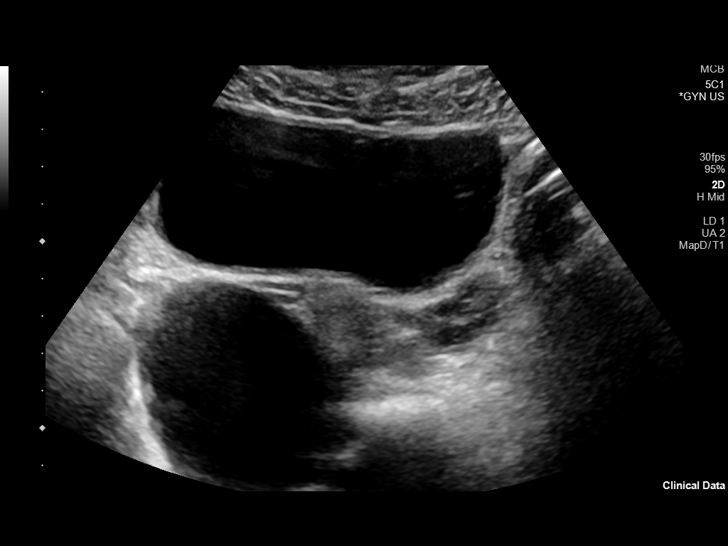
[im 89/153]
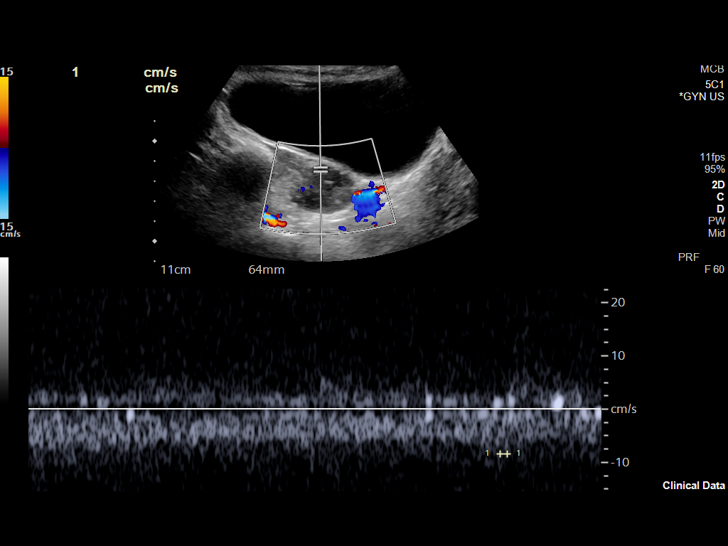
[im 102/153]
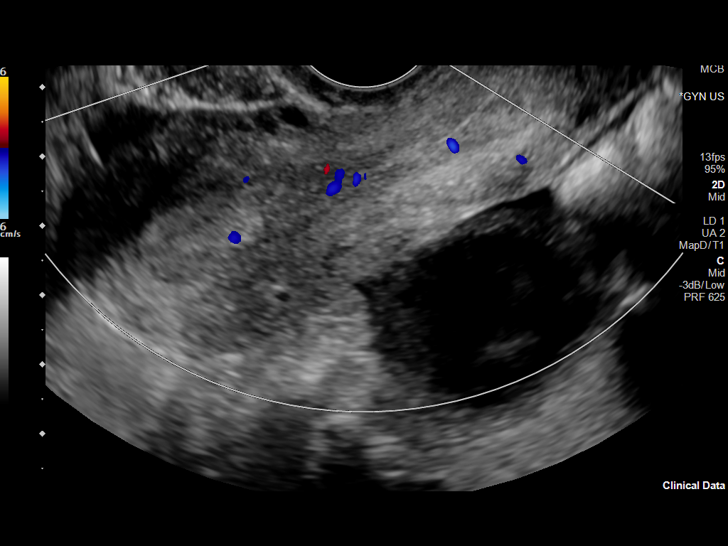
[im 115/153]
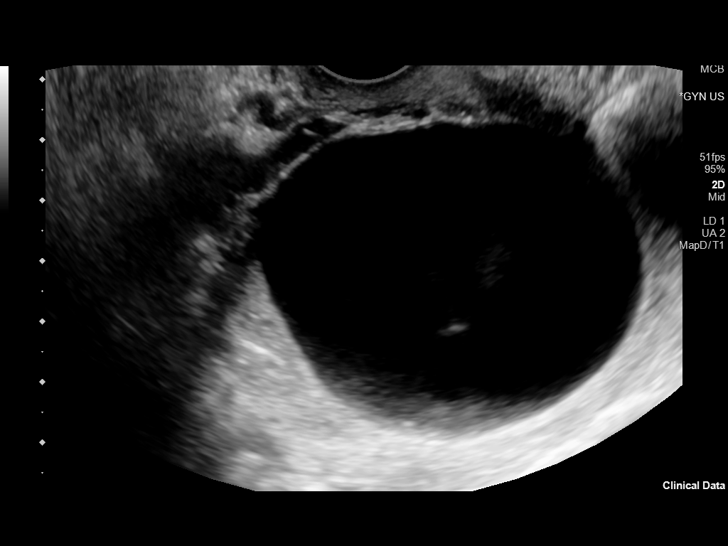
[im 127/153]
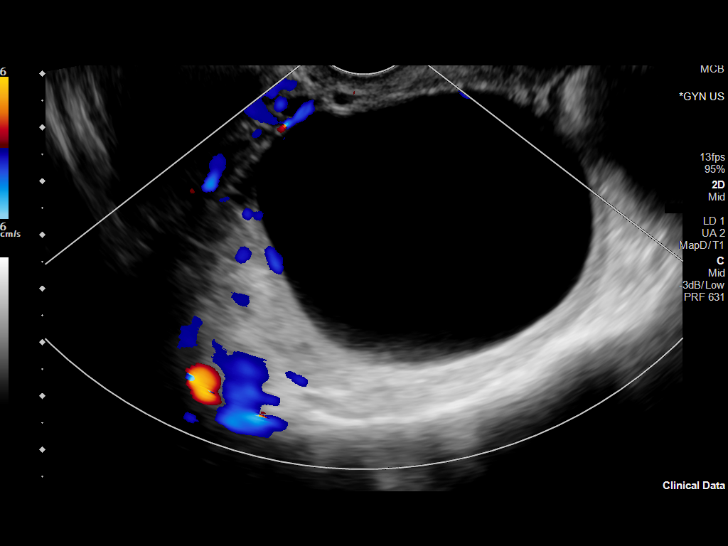
[im 140/153]
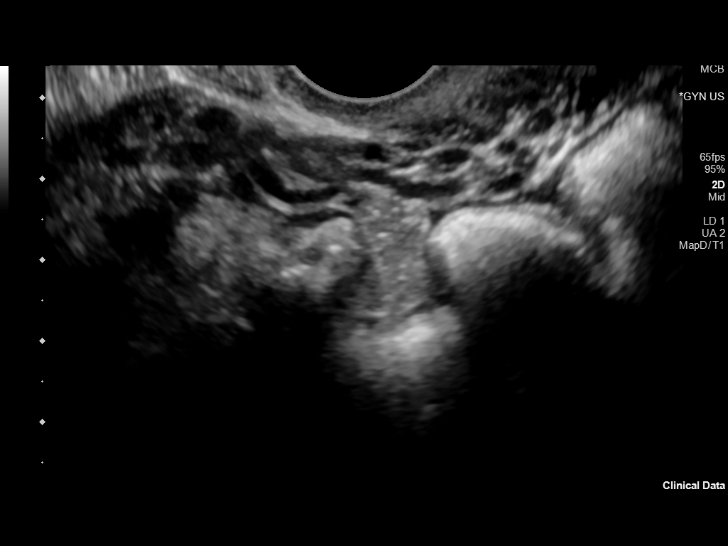
[im 153/153]
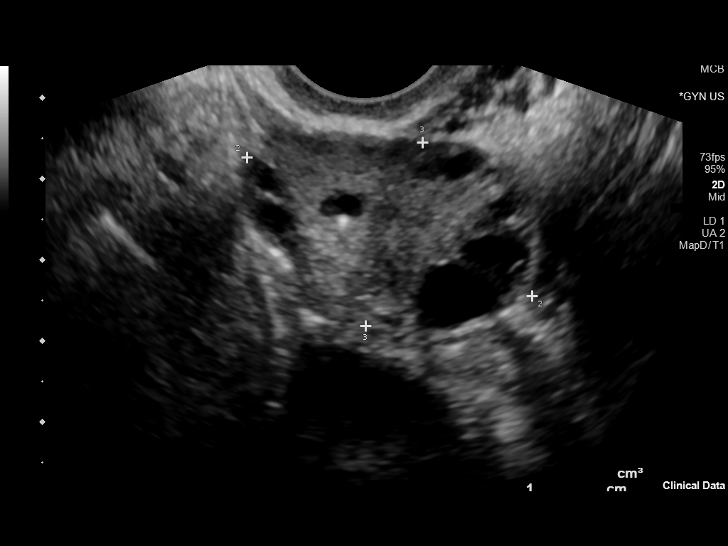

[13 of 25 positions shown; findings below may reference images not displayed]

FINDINGS: Uterus

Measurements: 9.4 x 3.0 x 5.0 cm = volume: 74 mL. No fibroids or
other mass visualized.

Endometrium

Thickness: 8 mm.  No focal abnormality visualized.

Right ovary

Measurements: 7.6 x 6.1 x 7.9 cm = volume: 188 mL. 6.1 x 5.9 x
cm simple cyst.

Left ovary

Measurements: 3.9 x 2.4 x 3.3 cm = volume: 16 mL. Normal
appearance/no adnexal mass.

Pulsed Doppler evaluation of both ovaries demonstrates normal
low-resistance arterial and venous waveforms.

Other findings

Trace free fluid in the pelvis, likely physiologic.
IMPRESSION: 1. No acute abnormality. No sonographic evidence of ovarian torsion.
2. 6.1 cm simple cyst in the right ovary. This has benign
characteristics. No imaging follow up is required for premenopausal
females. This follows consensus guidelines: Simple Adnexal Cysts:
SRU Consensus Conference Update on Follow-up and Reporting.

## 2021-04-19 ENCOUNTER — Telehealth: Payer: Self-pay

## 2021-04-19 NOTE — Telephone Encounter (Signed)
At 03/24/20 office visit Dr. Oscar La wrote "PLAN Recommend a f/u ultrasound in 6 months in [place] (will send records once she establishes care)"   Patient called today. She has moved out of state and has not yet seen a gyn there yet. She was last here in the office 03/24/20. She is calling because she is in town here for the next two weeks and wanted to see if any chance she could be seen to follow up on the ovarian cyst with a pelvic u/s and visit. She said she realizes it is short notice and schedules are full but she thought she would check.

## 2021-04-20 ENCOUNTER — Ambulatory Visit (INDEPENDENT_AMBULATORY_CARE_PROVIDER_SITE_OTHER): Payer: 59 | Admitting: Physician Assistant

## 2021-04-20 ENCOUNTER — Encounter: Payer: Self-pay | Admitting: Physician Assistant

## 2021-04-20 ENCOUNTER — Other Ambulatory Visit: Payer: Self-pay

## 2021-04-20 VITALS — BP 109/79 | HR 80

## 2021-04-20 DIAGNOSIS — N83201 Unspecified ovarian cyst, right side: Secondary | ICD-10-CM

## 2021-04-20 DIAGNOSIS — F902 Attention-deficit hyperactivity disorder, combined type: Secondary | ICD-10-CM | POA: Diagnosis not present

## 2021-04-20 MED ORDER — AMPHETAMINE-DEXTROAMPHETAMINE 10 MG PO TABS: ORAL_TABLET | ORAL | 0 refills | Status: DC

## 2021-04-20 NOTE — Telephone Encounter (Signed)
It's fine if the schedule allows. She is also overdue for an annual exam.

## 2021-04-20 NOTE — Telephone Encounter (Signed)
Spoke with patient and let her know that we have no available u/s openings in the next two weeks. She said she will be returning to [facility] again and she would like to see about scheduling an appointment in the future. U/s order placed. Message sent to [facility] to please call her and schedule. Mentioned AEX also due.

## 2021-04-20 NOTE — Progress Notes (Signed)
[facility] Check  Patient ID: Ana [name],  MRN: [id number]  PCP: Patient, No Pcp Per (Inactive)  Date of Evaluation: 04/20/2021 Time spent:30 minutes  Chief Complaint:  Chief Complaint    ADD; Follow-up      HISTORY/CURRENT STATUS: HPI To discuss getting back on something to help with ADD.  Ana [name] has been lost to follow-up for a couple of years due to COVID and the fact that she had not really needed treatment for ADD.  She was working from home most of the time so did not need to be so laser focused all the time.  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  She took Adderall in the past which was helpful.  The only problem she had sometimes was not being able to go to sleep.  Specifically, she could go to sleep as long as she disciplined herself to go to bed at a certain time.  But if she was not disciplined, she could stay up for hours because she had more energy and motivation to do things outside of work.  That occurred when she was on the extended release Adderall with or without an IR in the afternoon. She has a hard time concentrating and getting things done in a timely manner.  She gets distracted easily so wants to prevent that.  She now lives in [place] but returns to [place] frequently to see family and friends.  Since she works remotely she has no problem doing that.  Patient denies loss of interest in usual activities and is able to enjoy things.  Denies decreased energy or motivation.  Appetite has not changed.  No extreme sadness, tearfulness, or feelings of hopelessness.   Denies suicidal or homicidal thoughts.  Patient denies increased energy with decreased need for sleep, no increased talkativeness, no racing thoughts, no impulsivity or risky behaviors, no increased spending, no increased libido, no grandiosity, no increased irritability or anger, and  no hallucinations.  Denies dizziness, syncope, seizures, numbness, tingling, tremor, tics, unsteady gait, slurred speech, confusion. Denies muscle or joint pain, stiffness, or dystonia.  Individual Medical History/ Review of Systems: Changes? :No   Past medications for mental health diagnoses include: Adderall XR and IR  Allergies: Patient has no known allergies.  Current Medications:  Current Outpatient Medications:  .  amphetamine-dextroamphetamine (ADDERALL) 10 MG tablet, 1/2-1 po q am and may repeat 1/2-1 po qd at noon prn., Disp: 60 tablet, Rfl: 0 .  Multiple Vitamin (MULTIVITAMIN) tablet, Take 1 tablet by mouth daily., Disp: , Rfl:  Medication Side Effects: none  Family Medical/ Social History: Changes? Has moved to [place] recently. But works remotely.   MENTAL HEALTH EXAM:  Blood pressure 109/79, pulse 80.There is no height or weight on file to calculate BMI.  General Appearance: Casual and Well Groomed  Eye Contact:  Good  Speech:  Clear and Coherent and Normal Rate  Volume:  Normal  Mood:  Euthymic  Affect:  Appropriate  Thought Process:  Goal Directed and Descriptions of Associations: Circumstantial  Orientation:  Full (Time, Place, and Person)  Thought Content: Logical   Suicidal Thoughts:  No  Homicidal Thoughts:  No  Memory:  WNL  Judgement:  Good  Insight:  Good  Psychomotor Activity:  Normal  Concentration:  Concentration: Fair and Attention Span: Fair  Recall:  Good  Fund of Knowledge: Good  Language: Good  Assets:  Desire for Improvement  ADL's:  Intact  Cognition: WNL  Prognosis:  Good    DIAGNOSES:    ICD-10-CM   1. Attention deficit hyperactivity disorder (ADHD), combined type  F90.2     Receiving Psychotherapy: No    RECOMMENDATIONS:  PDMP was reviewed. I provided 30 minutes of face-to-face time during this encounter, including time spent before and after the visit in records review, medical decision making, and charting. She did respond well  to the Adderall so I recommend restarting that.  I think we should try the IR only, that way she can have more flexibility of the timing that she takes it as well as the dose.  Since she has not been on anything for a while, recommend start low and go up if needed.  She will call our office in approximately 3 weeks to let me know how well she is responding.  If she is doing well with the current dose then I will send in 3 more prescriptions of that dose.  If not, I will change the dose and go from there. She understands that I will be unable to do telehealth visits unless she is in the state of [place] at the time of the visit.  Since she does frequently return to [place] she would like to keep her care here as long as possible.  She is not sure if a controlled substance prescription sent by me can be filled in [place].  She will figure that out before calling in 3 weeks.  I do advise she look for a PCP or a psychiatric provider there. Restart Adderall IR 10 mg, 1/2-1 p.o. every morning and 1/2-1 p.o. around lunch as needed. Return in October when she will be back in [place], unless she has a new provider in [place].   Melony Overly, PA-C

## 2021-04-20 NOTE — Telephone Encounter (Signed)
Can you see if the schedule would allow for this patient to have u/s and visit with Dr. Oscar La in the next two weeks?

## 2021-04-20 NOTE — Telephone Encounter (Signed)
Ana [name], there is no opening in the schedule for an [place] with Dr. Oscar La in the next two weeks. Will you be calling the patient back or would you like for me to?

## 2021-04-21 DIAGNOSIS — F902 Attention-deficit hyperactivity disorder, combined type: Secondary | ICD-10-CM | POA: Insufficient documentation

## 2021-09-09 ENCOUNTER — Other Ambulatory Visit: Payer: Self-pay | Admitting: Obstetrics and Gynecology

## 2021-09-09 ENCOUNTER — Other Ambulatory Visit: Payer: Managed Care, Other (non HMO)

## 2021-09-13 ENCOUNTER — Ambulatory Visit: Payer: 59 | Admitting: Physician Assistant

## 2021-09-14 ENCOUNTER — Ambulatory Visit: Payer: Self-pay | Admitting: Obstetrics and Gynecology

## 2022-02-21 ENCOUNTER — Other Ambulatory Visit: Payer: Self-pay

## 2022-02-21 ENCOUNTER — Telehealth: Payer: Self-pay

## 2022-02-21 DIAGNOSIS — R1031 Right lower quadrant pain: Secondary | ICD-10-CM

## 2022-02-21 NOTE — Telephone Encounter (Signed)
Patient called to schedule u/s.  Her last AEX was 12/25/19. ? ?She is in town until 03/18/22. She reports for the last couple of weeks having familiar right ovarian pain.  Most the time she rates it a 2 on 1-10 pain scale but sometimes it is "jabbing pain" and when that occurs a 6-7 on the pain scale.  She said it started a couple weeks ago with menses but menses ended and the pain persists.  She would like to schedule u/s.  She said she will be back in May and again in June and could schedule her AEX then.  ?

## 2022-02-21 NOTE — Telephone Encounter (Signed)
Please go ahead and schedule the u/s and office visit.  ?

## 2022-02-21 NOTE — Telephone Encounter (Signed)
Spoke with patient and informed her ok for u/s and visit now. Order placed for u/s.  She can schedule AEX with her return visit in May or June. ? ?Appt desk informed to call patient to schedule these appointments. ?

## 2022-02-23 NOTE — Telephone Encounter (Signed)
Pelvic U/s and visit with Dr. Oscar La scheduled 02/24/22 and AEX scheduled 03/17/22. ?

## 2022-02-24 ENCOUNTER — Other Ambulatory Visit: Payer: Self-pay

## 2022-02-24 ENCOUNTER — Ambulatory Visit (INDEPENDENT_AMBULATORY_CARE_PROVIDER_SITE_OTHER): Payer: Managed Care, Other (non HMO)

## 2022-02-24 ENCOUNTER — Ambulatory Visit (INDEPENDENT_AMBULATORY_CARE_PROVIDER_SITE_OTHER): Payer: Managed Care, Other (non HMO) | Admitting: Obstetrics and Gynecology

## 2022-02-24 VITALS — BP 122/68 | HR 66 | Ht 68.5 in | Wt 185.0 lb

## 2022-02-24 DIAGNOSIS — N83201 Unspecified ovarian cyst, right side: Secondary | ICD-10-CM | POA: Diagnosis not present

## 2022-02-24 DIAGNOSIS — R102 Pelvic and perineal pain: Secondary | ICD-10-CM | POA: Diagnosis not present

## 2022-02-24 DIAGNOSIS — R1031 Right lower quadrant pain: Secondary | ICD-10-CM | POA: Diagnosis not present

## 2022-02-24 NOTE — Progress Notes (Signed)
GYNECOLOGY  VISIT ?  ?HPI: ?36 y.o.   Single White or Caucasian Not Hispanic or Latino  female   ?G0P0000 with No LMP recorded.   ?here for evaluation of right sided pelvic pain.    ? ?She has a h/o 6.1 x 5.9 x 4.9  cm simple right ovarian cyst that was diagnosed in 10/20.  ?She had a f/u u/s in 12/20 where the cyst measured 6.27 x 5.19 cm and again in 4/21 and the cyst measured 6.64 x 5.3 cm.  ?She was advised of the risk of torsion and rupture, surgery and surveillance were discussed. She chose surveillance. ?She was moving to [place] and it was recommended that she have a f/u ultrasound in 10/22. She wasn't able to do that. She is living there to be near family, but considering moving back here. She can work remotely. No current partner.  ? ?She has a 2-3 week h/o "right ovarian pain". Her baseline pain is present most of the time ~2/10 in severity, aching in character. She also has intermittent sharp pains, up to a 6/10 in severity.  ? ?She is having brownish discharge today, last cycle was 2 weeks ago. She doesn't typically bleed between her cycles.  ? ?Long h/o irregular cycles. Typically cycles every 5-6 weeks, occasionally skips a month. Bleeds for 4 days. ? ? ?GYNECOLOGIC HISTORY: ?No LMP recorded. ?Contraception: none  ?Menopausal hormone therapy: none  ?       ?OB History   ? ? Gravida  ?0  ? Para  ?0  ? Term  ?0  ? Preterm  ?0  ? AB  ?0  ? Living  ?0  ?  ? ? SAB  ?0  ? IAB  ?0  ? Ectopic  ?0  ? Multiple  ?0  ? Live Births  ?0  ?   ?  ?  ?    ? ?Patient Active Problem List  ? Diagnosis Date Noted  ? Attention deficit hyperactivity disorder (ADHD), combined type 04/21/2021  ? PCOS (polycystic ovarian syndrome) 01/14/2019  ? ? ?Past Medical History:  ?Diagnosis Date  ? Anxiety   ? Infertility, female   ? Irregular menstrual cycle   ? PCOS (polycystic ovarian syndrome)   ? ? ?No past surgical history on file. ? ?Current Outpatient Medications  ?Medication Sig Dispense Refill  ? amphetamine-dextroamphetamine  (ADDERALL) 10 MG tablet 1/2-1 po q am and may repeat 1/2-1 po qd at noon prn. 60 tablet 0  ? Multiple Vitamin (MULTIVITAMIN) tablet Take 1 tablet by mouth daily.    ? ?No current facility-administered medications for this visit.  ?  ? ?ALLERGIES: Patient has no known allergies. ? ?Family History  ?Problem Relation Age of Onset  ? Thyroid disease Mother   ? Hypertension Sister   ? Thyroid disease Sister   ? Stroke Maternal Grandmother   ? Heart failure Maternal Grandmother   ? Prostate cancer Maternal Grandfather   ? Diabetes Maternal Grandfather   ? ? ?Social History  ? ?Socioeconomic History  ? Marital status: Single  ?  Spouse name: Not on file  ? Number of children: Not on file  ? Years of education: Not on file  ? Highest education level: Not on file  ?Occupational History  ? Not on file  ?Tobacco Use  ? Smoking status: Never  ? Smokeless tobacco: Never  ?Vaping Use  ? Vaping Use: Never used  ?Substance and Sexual Activity  ? Alcohol use: Yes  ?  Comment: 2 per month  ? Drug use: No  ? Sexual activity: Yes  ?  Partners: Male  ?  Birth control/protection: None  ?Other Topics Concern  ? Not on file  ?Social History Narrative  ? Not on file  ? ?Social Determinants of Health  ? ?Financial Resource Strain: Not on file  ?Food Insecurity: Not on file  ?Transportation Needs: Not on file  ?Physical Activity: Not on file  ?Stress: Not on file  ?Social Connections: Not on file  ?Intimate Partner Violence: Not on file  ? ? ?Review of Systems  ?All other systems reviewed and are negative. ? ?PHYSICAL EXAMINATION:   ? ?There were no vitals taken for this visit.    ?General appearance: alert, cooperative and appears stated age ? ?Pelvic ultrasound ? ?Indications: pelvic pain ? ?Findings: ? ?Uterus 8.06 x 4.66 x 3.32 cm, anteverted ? ?Endometrium 8.39 mm ? ?Left ovary 3.69 x 3.31 x 1.89 cm, polycystic appearing ? ?Right ovary 7.94 x 7.48 x 6.08 ?6.82 x 5.47 cm simple cyst (average 6.14 cm) ?Normal perfusion ? ?No free  fluid ? ?Impression:  ?Normal sized, anteverted uterus ?Secretory endometrium, no masses ?Left ovary with polycystic appearance ?6.8 cm avascular, hypoechoic cyst of the right ovary, slightly larger when compared to 12/20 ultrasound (6.3 x 5.2 cm) ?No signs of torsion of the right ovary ? ?1. Pelvic pain ?Pain is tolerable, slightly enlarging right ovarian cyst ? ?2. Right ovarian cyst ?Large, benign appearing, persistent cyst. Her options are continued surveillance vs surgery ?Given the size of the cyst, her pain, and the risk of torsion or rupture I would recommend surgery. Surgery would include laparoscopy with right ovarian cystectomy, possible right oophorectomy.  ?She is currently living in [place], she could do the surgery there or here. I don't think it is urgent.  ? ?In addition to reviewing the ultrasound, over 20 minutes was spent in total patient care.  ?

## 2022-03-03 ENCOUNTER — Encounter: Payer: Self-pay | Admitting: Obstetrics and Gynecology

## 2022-03-03 ENCOUNTER — Telehealth: Payer: Self-pay | Admitting: Obstetrics and Gynecology

## 2022-03-03 ENCOUNTER — Ambulatory Visit (INDEPENDENT_AMBULATORY_CARE_PROVIDER_SITE_OTHER): Payer: 59 | Admitting: Physician Assistant

## 2022-03-03 ENCOUNTER — Encounter: Payer: Self-pay | Admitting: Physician Assistant

## 2022-03-03 DIAGNOSIS — F902 Attention-deficit hyperactivity disorder, combined type: Secondary | ICD-10-CM | POA: Diagnosis not present

## 2022-03-03 DIAGNOSIS — F320 Major depressive disorder, single episode, mild: Secondary | ICD-10-CM | POA: Diagnosis not present

## 2022-03-03 DIAGNOSIS — F411 Generalized anxiety disorder: Secondary | ICD-10-CM

## 2022-03-03 MED ORDER — CLONAZEPAM 0.5 MG PO TABS: 0.2500 mg | ORAL_TABLET | Freq: Two times a day (BID) | ORAL | 0 refills | Status: DC | PRN

## 2022-03-03 MED ORDER — ATOMOXETINE HCL 25 MG PO CAPS: 25.0000 mg | ORAL_CAPSULE | Freq: Every day | ORAL | 1 refills | Status: DC

## 2022-03-03 MED ORDER — AMPHETAMINE-DEXTROAMPHETAMINE 10 MG PO TABS: ORAL_TABLET | ORAL | 0 refills | Status: DC

## 2022-03-03 NOTE — Telephone Encounter (Signed)
See my chart message

## 2022-03-03 NOTE — Progress Notes (Signed)
[facility] ? ?Patient ID: Ana [name],  ?MRN: [id number] ? ?PCP: Patient, No Pcp Per (Inactive) ? ?Date of Evaluation: 03/03/2022  ?time spent:30 minutes ? ?Chief Complaint:  ?Chief Complaint   ?ADHD; Follow-up ?  ? ? ?HISTORY/CURRENT STATUS: ?HPI F/u ADHD, also a lot of anxiety ? ?Has chronic stress.  She now lives in [place] but is in [place] right now to see family and friends. Not really happy there, in past few year, broke off an engagement, moved to [place], started a new job. Has generalized unsettled feeling, no PA but there have been a few times when she wakes up out of sleep and is anxious. Work is going well, but has some focus issues, takes the Adderall very rarely (Rx from 5/22 lasted all this time.)  The Adderall does seem to make her a bit more anxious so she is careful not to take it too often. ? ?Does not feel like she is really a depressed person but with all the stress and changes she has had in the past couple of years she has been more sad.  She is not missing work.  Patient denies loss of interest in usual activities and is able to enjoy things.  Denies decreased energy or motivation.  Appetite has not changed.  No extreme sadness, tearfulness, or feelings of hopelessness.  ADLs and personal hygiene are normal.  Not isolating.  Denies suicidal or homicidal thoughts. ? ?Patient denies increased energy with decreased need for sleep, no increased talkativeness, no racing thoughts, no impulsivity or risky behaviors, no increased spending, no increased libido, no grandiosity, no increased irritability or anger, and no hallucinations. ? ?Denies dizziness, syncope, seizures, numbness, tingling, tremor, tics, unsteady gait, slurred speech, confusion. Denies muscle or joint pain, stiffness, or dystonia. ? ?Individual Medical History/ Review of Systems: Changes? :No  ? ?Past medications for mental health diagnoses include: ?Adderall XR and IR ? ?Allergies: Patient has no known  allergies. ? ?Current Medications:  ?Current Outpatient Medications:  ?  atomoxetine (STRATTERA) 25 MG capsule, Take 1 capsule (25 mg total) by mouth daily., Disp: 30 capsule, Rfl: 1 ?  clonazePAM (KLONOPIN) 0.5 MG tablet, Take 0.5-1 tablets (0.25-0.5 mg total) by mouth 2 (two) times daily as needed for anxiety., Disp: 30 tablet, Rfl: 0 ?  amphetamine-dextroamphetamine (ADDERALL) 10 MG tablet, 1/2-1 po q am and may repeat 1/2-1 po qd at noon prn., Disp: 60 tablet, Rfl: 0 ?Medication Side Effects: none ? ?Family Medical/ Social History: Changes? Has moved to [place] recently. But works remotely.  ? ?MENTAL HEALTH EXAM: ? ?Last menstrual period 02/11/2022.There is no height or weight on file to calculate BMI.  ?General Appearance: Casual and Well Groomed  ?Eye Contact:  Good  ?Speech:  Clear and Coherent and Normal Rate  ?Volume:  Normal  ?Mood:  Euthymic  ?Affect:  Appropriate  ?Thought Process:  Goal Directed and Descriptions of Associations: Circumstantial  ?Orientation:  Full (Time, Place, and Person)  ?Thought Content: Logical   ?Suicidal Thoughts:  No  ?Homicidal Thoughts:  No  ?Memory:  WNL  ?Judgement:  Good  ?Insight:  Good  ?Psychomotor Activity:  Normal  ?Concentration:  Concentration: Fair and Attention Span: Fair  ?Recall:  Good  ?Fund of Knowledge: Good  ?Language: Good  ?Assets:  Desire for Improvement ?Financial Resources/Insurance ?Housing ?Transportation ?Vocational/Educational  ?ADL's:  Intact  ?Cognition: WNL  ?Prognosis:  Good  ? ? ?DIAGNOSES:  ?  ICD-10-CM   ?1. Attention deficit hyperactivity disorder (ADHD), combined  type  F90.2   ?  ?2. Generalized anxiety disorder  F41.1   ?  ?3. Mild major depression, single episode (HCC)  F32.0   ?  ? ? ? ?Receiving Psychotherapy: No  ? ? ?RECOMMENDATIONS:  ?PDMP was reviewed.  No results available. ?I provided 20 minutes of face to face time during this encounter, including time spent before and after the visit in records review, medical decision making,  counseling pertinent to today's visit, and charting.  ?Discussed different options for the anxiety and ADHD, recommend Strattera because it can help both problems.  Benefits, risks and side effects were discussed and she accepts. ? ?Start Strattera 25 mg, 1 p.o. every morning. ?Continue Adderall IR 10 mg, 1/2-1 p.o. every morning and 1/2-1 p.o. around lunch as needed. ?Start Klonopin 0.5 mg, 1/2-1 p.o. twice daily as needed.  Do not take at the same time she takes the Adderall. ?Return in 6 to 8 weeks. ? ? Melony Overly, PA-C  ?

## 2022-03-14 NOTE — Progress Notes (Signed)
36 y.o. G0P0000 Single White or Caucasian Not Hispanic or Latino female here for annual exam. She is having right lower quadrant pain.   ?Cycles typically every 5 weeks, range from 4-6 weeks. Occasionally skips a month.  ?Recently sexually active with a new partner, serious. They are using w/d for contraception, not interested in other contraception.  ?Period Duration (Days): 4 ?Period Pattern: (!) Irregular ?Menstrual Flow: Moderate ?Menstrual Control: Other (Comment) (menstral cup) ?Menstrual Control Change Freq (Hours): 3 ?Dysmenorrhea: (!) Mild ?Dysmenorrhea Symptoms: Cramping ? ?Patient's last menstrual period was 01/26/2022.          ?Sexually active: Yes.    ?The current method of family planning is none.    ?Exercising: Yes.     Rock climbing  ?Smoker:  no ? ?Health Maintenance: ?Pap:  12/08/17 WNL  ?History of abnormal Pap:  no ?MMG:  none  ?BMD:   none  ?Colonoscopy:none   ?TDaP:  unsure  ?Gardasil: none, declines   ? ? reports that she has never smoked. She has never used smokeless tobacco. She reports current alcohol use. She reports that she does not use drugs. Social ETOH. 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 Moving back to [place].  ?She has a Insurance account manager.  ? ?Past Medical History:  ?Diagnosis Date  ? Anxiety   ? Infertility, female   ? Irregular menstrual cycle   ? PCOS (polycystic ovarian syndrome)   ? ? ?No past surgical history on file. ? ?Current Outpatient Medications  ?Medication Sig Dispense Refill  ? amphetamine-dextroamphetamine (ADDERALL) 10 MG tablet 1/2-1 po q am and may repeat 1/2-1 po qd at noon prn. 60 tablet 0  ? atomoxetine (STRATTERA) 25 MG capsule Take 1 capsule (25 mg total) by mouth daily. 30 capsule 1  ? clonazePAM (KLONOPIN) 0.5 MG tablet Take 0.5-1 tablets (0.25-0.5 mg total) by mouth 2 (two) times daily as needed for anxiety. 30 tablet 0  ? ?No current facility-administered medications for this visit.  ? ? ?Family History  ?Problem Relation Age of Onset   ? Thyroid disease Mother   ? Hypertension Sister   ? Thyroid disease Sister   ? Stroke Maternal Grandmother   ? Heart failure Maternal Grandmother   ? Prostate cancer Maternal Grandfather   ? Diabetes Maternal Grandfather   ? ? ?Review of Systems  ?All other systems reviewed and are negative. ? ?Exam:   ?BP 122/74 (BP Location: Right Arm, Patient Position: Sitting, Cuff Size: Large)   Pulse 66   Ht 5' 8.5" (1.74 m)   Wt 184 lb (83.5 kg)   LMP 01/26/2022   SpO2 99%   BMI 27.57 kg/m?   Weight change: @WEIGHTCHANGE @ Height:   Height: 5' 8.5" (174 cm)  ?Ht Readings from Last 3 Encounters:  ?03/17/22 5' 8.5" (1.74 m)  ?02/24/22 5' 8.5" (1.74 m)  ?03/24/20 5\' 8"  (1.727 m)  ? ? ?General appearance: alert, cooperative and appears stated age ?Head: Normocephalic, without obvious abnormality, atraumatic ?Neck: no adenopathy, supple, symmetrical, trachea midline and thyroid normal to inspection and palpation ?Lungs: clear to auscultation bilaterally ?Cardiovascular: regular rate and rhythm ?Breasts: normal appearance, no masses or tenderness ?Abdomen: soft, non-tender; non distended,  no masses,  no organomegaly ?Extremities: extremities normal, atraumatic, no cyanosis or edema ?Skin: Skin color, texture, turgor normal. No rashes or lesions ?Lymph nodes: Cervical, supraclavicular, and axillary nodes normal. ?No abnormal inguinal nodes palpated ?Neurologic: Grossly normal ? ? ?Pelvic: External genitalia:  no lesions ?  Urethra:  normal appearing urethra with no masses, tenderness or lesions ?             Bartholins and Skenes: normal    ?             Vagina: normal appearing vagina with normal color and discharge, no lesions ?             Cervix: no lesions ?              ?Bimanual Exam:  Uterus:  normal size, contour, position, consistency, mobility, non-tender ?             Adnexa:  ~7 cm cystic mass in the right adnexa, felt best on RV exam.  ?              Rectovaginal: Confirms ?              Anus:   normal sphincter tone, no lesions ? ?Ana [name], CMA chaperoned for the exam. ? ?1. Well woman exam ?Discussed breast self exam ?Discussed calcium and vit D intake ? ?2. Right ovarian cyst ?She has a 6.8 cm simple right ovarian cyst, it has grown slightly in the last 2 years. She has had pain and we have discussed surgery continued surveillance.  ?-she is considering surgery in the fall, if not I would recommend a f/u U/S next year.  ? ?3. PCOS (polycystic ovarian syndrome) ? ?4. Irregular menses ?No change, rarely skips a month ? ?5. Screening for cervical cancer ?- Cytology - PAP ? ?6. General counseling and advice on female contraception ?Using w/d, declines other forms of contraception.  ?- ulipristal acetate (ELLA) 30 MG tablet; Take 1 tablet (30 mg total) by mouth once for 1 dose.  Dispense: 1 tablet; Refill: 0 ? ?7. Immunization due ?TDAP today ? ?8. Screening examination for STD (sexually transmitted disease) ?- HIV Antibody (routine testing w rflx) ?- RPR ?- SURESWAB CT/NG/T. vaginalis ? ?9. Laboratory exam ordered as part of routine general medical examination ?- CBC ?- Comprehensive metabolic panel ?- Lipid panel ? ?

## 2022-03-17 ENCOUNTER — Encounter: Payer: Self-pay | Admitting: Obstetrics and Gynecology

## 2022-03-17 ENCOUNTER — Ambulatory Visit (INDEPENDENT_AMBULATORY_CARE_PROVIDER_SITE_OTHER): Payer: Managed Care, Other (non HMO) | Admitting: Obstetrics and Gynecology

## 2022-03-17 ENCOUNTER — Other Ambulatory Visit (HOSPITAL_COMMUNITY)
Admission: RE | Admit: 2022-03-17 | Discharge: 2022-03-17 | Disposition: A | Discharge status: Home / Self Care | Payer: Managed Care, Other (non HMO) | Source: Ambulatory Visit | Attending: Obstetrics and Gynecology | Admitting: Obstetrics and Gynecology

## 2022-03-17 VITALS — BP 122/74 | HR 66 | Ht 68.5 in | Wt 184.0 lb

## 2022-03-17 DIAGNOSIS — E282 Polycystic ovarian syndrome: Secondary | ICD-10-CM | POA: Diagnosis not present

## 2022-03-17 DIAGNOSIS — N926 Irregular menstruation, unspecified: Secondary | ICD-10-CM | POA: Diagnosis not present

## 2022-03-17 DIAGNOSIS — N83201 Unspecified ovarian cyst, right side: Secondary | ICD-10-CM | POA: Diagnosis not present

## 2022-03-17 DIAGNOSIS — Z01419 Encounter for gynecological examination (general) (routine) without abnormal findings: Secondary | ICD-10-CM | POA: Diagnosis not present

## 2022-03-17 DIAGNOSIS — Z124 Encounter for screening for malignant neoplasm of cervix: Secondary | ICD-10-CM | POA: Insufficient documentation

## 2022-03-17 DIAGNOSIS — Z0000 Encounter for general adult medical examination without abnormal findings: Secondary | ICD-10-CM

## 2022-03-17 DIAGNOSIS — Z3009 Encounter for other general counseling and advice on contraception: Secondary | ICD-10-CM

## 2022-03-17 DIAGNOSIS — Z23 Encounter for immunization: Secondary | ICD-10-CM | POA: Diagnosis not present

## 2022-03-17 DIAGNOSIS — Z113 Encounter for screening for infections with a predominantly sexual mode of transmission: Secondary | ICD-10-CM

## 2022-03-17 MED ORDER — ULIPRISTAL ACETATE 30 MG PO TABS: 1.0000 | ORAL_TABLET | Freq: Once | ORAL | 0 refills | Status: AC

## 2022-03-17 NOTE — Patient Instructions (Signed)

## 2022-03-18 LAB — SURESWAB CT/NG/T. VAGINALIS
C. trachomatis RNA, TMA: NOT DETECTED
N. gonorrhoeae RNA, TMA: NOT DETECTED
Trichomonas vaginalis RNA: NOT DETECTED

## 2022-03-22 LAB — CYTOLOGY - PAP
Comment: NEGATIVE
Diagnosis: NEGATIVE
High risk HPV: NEGATIVE

## 2022-03-24 ENCOUNTER — Other Ambulatory Visit: Payer: Self-pay

## 2022-03-24 DIAGNOSIS — D649 Anemia, unspecified: Secondary | ICD-10-CM

## 2022-03-24 LAB — COMPREHENSIVE METABOLIC PANEL
AG Ratio: 1.7 (calc) (ref 1.0–2.5)
ALT: 11 U/L (ref 6–29)
AST: 20 U/L (ref 10–30)
Albumin: 4.3 g/dL (ref 3.6–5.1)
Alkaline phosphatase (APISO): 56 U/L (ref 31–125)
BUN: 8 mg/dL (ref 7–25)
CO2: 28 mmol/L (ref 20–32)
Calcium: 9.1 mg/dL (ref 8.6–10.2)
Chloride: 107 mmol/L (ref 98–110)
Creat: 0.89 mg/dL (ref 0.50–0.97)
Globulin: 2.5 g/dL (calc) (ref 1.9–3.7)
Glucose, Bld: 80 mg/dL (ref 65–99)
Potassium: 4.1 mmol/L (ref 3.5–5.3)
Sodium: 142 mmol/L (ref 135–146)
Total Bilirubin: 0.3 mg/dL (ref 0.2–1.2)
Total Protein: 6.8 g/dL (ref 6.1–8.1)

## 2022-03-24 LAB — CBC
HCT: 35.8 % (ref 35.0–45.0)
Hemoglobin: 11.5 g/dL — ABNORMAL LOW (ref 11.7–15.5)
MCH: 28.7 pg (ref 27.0–33.0)
MCHC: 32.1 g/dL (ref 32.0–36.0)
MCV: 89.3 fL (ref 80.0–100.0)
MPV: 10.1 fL (ref 7.5–12.5)
Platelets: 341 10*3/uL (ref 140–400)
RBC: 4.01 10*6/uL (ref 3.80–5.10)
RDW: 14.1 % (ref 11.0–15.0)
WBC: 6.9 10*3/uL (ref 3.8–10.8)

## 2022-03-24 LAB — RPR: RPR Ser Ql: NONREACTIVE

## 2022-03-24 LAB — FERRITIN: Ferritin: 9 ng/mL — ABNORMAL LOW (ref 16–154)

## 2022-03-24 LAB — LIPID PANEL
Cholesterol: 171 mg/dL (ref <200)
HDL: 100 mg/dL (ref >=50)
LDL Cholesterol (Calc): 58 mg/dL (calc)
Non-HDL Cholesterol (Calc): 71 mg/dL (calc) (ref <130)
Total CHOL/HDL Ratio: 1.7 (calc) (ref <5.0)
Triglycerides: 54 mg/dL (ref <150)

## 2022-03-24 LAB — HIV ANTIBODY (ROUTINE TESTING W REFLEX): HIV 1&2 Ab, 4th Generation: NONREACTIVE

## 2022-04-18 ENCOUNTER — Encounter: Payer: Self-pay | Admitting: Physician Assistant

## 2022-04-18 ENCOUNTER — Ambulatory Visit (INDEPENDENT_AMBULATORY_CARE_PROVIDER_SITE_OTHER): Payer: 59 | Admitting: Physician Assistant

## 2022-04-18 DIAGNOSIS — F320 Major depressive disorder, single episode, mild: Secondary | ICD-10-CM

## 2022-04-18 DIAGNOSIS — F902 Attention-deficit hyperactivity disorder, combined type: Secondary | ICD-10-CM

## 2022-04-18 DIAGNOSIS — F411 Generalized anxiety disorder: Secondary | ICD-10-CM | POA: Diagnosis not present

## 2022-04-18 MED ORDER — ATOMOXETINE HCL 40 MG PO CAPS: 40.0000 mg | ORAL_CAPSULE | Freq: Every day | ORAL | 1 refills | Status: DC

## 2022-04-18 MED ORDER — CLONAZEPAM 0.5 MG PO TABS: 0.2500 mg | ORAL_TABLET | Freq: Two times a day (BID) | ORAL | 0 refills | Status: DC | PRN

## 2022-04-18 MED ORDER — AMPHETAMINE-DEXTROAMPHETAMINE 10 MG PO TABS: ORAL_TABLET | ORAL | 0 refills | Status: DC

## 2022-04-18 NOTE — Progress Notes (Signed)
[facility] ? ?Patient ID: Ana [name],  ?MRN: [id number] ? ?PCP: Patient, No Pcp Per (Inactive) ? ?Date of Evaluation: 04/18/2022  ?time spent:20 minutes ? ?Chief Complaint:  ?Chief Complaint   ?ADD; Anxiety ?  ? ?HISTORY/CURRENT STATUS: ?HPI for routine med check ? ?Still getting distracted easily. Has been on Strattera for a month, and takes the Adderall as needed.  If she takes the afternoon dose it does keep her awake.  It is helpful when she takes it in the morning though.  States she has impulsive thoughts and then she can go down a rabbit hole because she gets so distracted.  It is not affecting her job too much but it would be great if she could get this under better control.  She will obsess or ruminate about things some of the time, no real compulsions although she does like to have things neat and in a certain way. ? ?Patient denies loss of interest in usual activities and is able to enjoy things.  Denies decreased energy.  Denies decreased motivation.  ADLs and personal hygiene are normal.  Appetite has not changed.  Weight is stable.  No extreme sadness, tearfulness, or feelings of hopelessness.  Sleeps well most of the time.  She does have anxiety on occasion and has taken the Klonopin a few times.  Not often but it has been very helpful.  Just gets overwhelmed, no panic attacks.  Denies suicidal or homicidal thoughts. ? ?Patient denies increased energy with decreased need for sleep, no increased talkativeness, no racing thoughts, no impulsivity or risky behaviors, no increased spending, no increased libido, no grandiosity, no increased irritability or anger, and no hallucinations. ? ?Denies dizziness, syncope, seizures, numbness, tingling, tremor, tics, unsteady gait, slurred speech, confusion. Denies muscle or joint pain, stiffness, or dystonia. ? ?Individual Medical History/ Review of Systems: Changes? :No  ? ?Past medications for mental health diagnoses include: ?Adderall XR and  IR, Strattera,  ? ?Allergies: Patient has no known allergies. ? ?Current Medications:  ?Current Outpatient Medications:  ?  atomoxetine (STRATTERA) 40 MG capsule, Take 1 capsule (40 mg total) by mouth daily., Disp: 30 capsule, Rfl: 1 ?  amphetamine-dextroamphetamine (ADDERALL) 10 MG tablet, 1/2-1 po q am and may repeat 1/2-1 po qd at noon prn., Disp: 60 tablet, Rfl: 0 ?  clonazePAM (KLONOPIN) 0.5 MG tablet, Take 0.5-1 tablets (0.25-0.5 mg total) by mouth 2 (two) times daily as needed for anxiety., Disp: 30 tablet, Rfl: 0 ?Medication Side Effects: none ? ?Family Medical/ Social History: Changes?  Got engaged this past weekend.  She is in the process of moving back to [place]. ? ?MENTAL HEALTH EXAM: ? ?There were no vitals taken for this visit.There is no height or weight on file to calculate BMI.  ?General Appearance: Casual and Well Groomed  ?Eye Contact:  Good  ?Speech:  Clear and Coherent and Normal Rate  ?Volume:  Normal  ?Mood:  Euthymic  ?Affect:  Appropriate  ?Thought Process:  Goal Directed and Descriptions of Associations: Circumstantial  ?Orientation:  Full (Time, Place, and Person)  ?Thought Content: Logical   ?Suicidal Thoughts:  No  ?Homicidal Thoughts:  No  ?Memory:  WNL  ?Judgement:  Good  ?Insight:  Good  ?Psychomotor Activity:  Normal  ?Concentration:  Concentration: Good and Attention Span: Fair  ?Recall:  Good  ?Fund of Knowledge: Good  ?Language: Good  ?Assets:  Desire for Improvement ?Financial Resources/Insurance ?Housing ?Transportation ?Vocational/Educational  ?ADL's:  Intact  ?Cognition: WNL  ?Prognosis:  Good  ? ? ?DIAGNOSES:  ?  ICD-10-CM   ?1. Attention deficit hyperactivity disorder (ADHD), combined type  F90.2   ?  ?2. Mild major depression, single episode (HCC)  F32.0   ?  ?3. Generalized anxiety disorder  F41.1   ?  ? ? ?Receiving Psychotherapy: No  ? ? ?RECOMMENDATIONS:  ?PDMP was reviewed.  No results available. ?I provided 20 minutes of face to face time during this  encounter, including time spent before and after the visit in records review, medical decision making, counseling pertinent to today's visit, and charting.  ?We discussed her symptoms, states it seems she has more anxiety when her mind is all over the place and she cannot get things done the way they need to be.  I recommend increasing Strattera.  When ADD symptoms are controlled the anxiety often improves as well. ? ?Continue Adderall IR 10 mg, 1/2-1 p.o. every morning and 1/2-1 p.o. around lunch as needed. ?Increase Strattera to 40 mg, 1 p.o. every morning. ?Continue Klonopin 0.5 mg, 1/2-1 p.o. twice daily as needed.  Do not take at the same time she takes the Adderall. ?Return in 4 weeks. ? ? Melony Overly, PA-C  ?

## 2022-05-16 ENCOUNTER — Encounter: Payer: Self-pay | Admitting: Physician Assistant

## 2022-05-16 ENCOUNTER — Ambulatory Visit (INDEPENDENT_AMBULATORY_CARE_PROVIDER_SITE_OTHER): Payer: 59 | Admitting: Physician Assistant

## 2022-05-16 DIAGNOSIS — F902 Attention-deficit hyperactivity disorder, combined type: Secondary | ICD-10-CM

## 2022-05-16 DIAGNOSIS — F411 Generalized anxiety disorder: Secondary | ICD-10-CM

## 2022-05-16 NOTE — Progress Notes (Signed)
[facility]  Patient ID: Ana [name],  MRN: [id number]  PCP: Patient, No Pcp Per (Inactive)  Date of Evaluation: 05/16/2022  time spent:20 minutes  Chief Complaint:  Chief Complaint   ADD; Anxiety; Follow-up    HISTORY/CURRENT STATUS: HPI for routine med check  Increased Strattera last month. Hasn't felt like it's changed anything with focus.  But it is a little difficult to tell.  She has so much going on, she got engaged and is now getting married this weekend.  Also moved back from [place] but does have to go back in another month or 2 to wrap things up there and then will completely be back in [place] for good.  So it is hard to tell if the Strattera is helping with concentration and focus or not.  She does take one fourth of Adderall every morning and sometimes another 1 in the late morning.  So not over 1/2 pill daily.  If she does take it too late then she has trouble going to sleep at night.  She does have a little bit of anxiety but not much.  Not needing the Klonopin very often.  We were hoping that Strattera would help her mood as well as focus.  Patient denies loss of interest in usual activities and is able to enjoy things.  Denies decreased energy.  Denies decreased motivation.  Work is going well.  ADLs and personal hygiene are normal.  Appetite has not changed.  Weight is stable.  No extreme sadness, tearfulness, or feelings of hopelessness.  Sleeps well most of the time.  Denies suicidal or homicidal thoughts.  Patient denies increased energy with decreased need for sleep, no increased talkativeness, no racing thoughts, no impulsivity or risky behaviors, no increased spending, no increased libido, no grandiosity, no increased irritability or anger, and no hallucinations.  Denies dizziness, syncope, seizures, numbness, tingling, tremor, tics, unsteady gait, slurred speech, confusion. Denies muscle or joint pain, stiffness, or dystonia.  Individual  Medical History/ Review of Systems: Changes? :No   Past medications for mental health diagnoses include: Adderall XR and IR, Strattera,   Allergies: Patient has no known allergies.  Current Medications:  Current Outpatient Medications:    amphetamine-dextroamphetamine (ADDERALL) 10 MG tablet, 1/2-1 po q am and may repeat 1/2-1 po qd at noon prn., Disp: 60 tablet, Rfl: 0   atomoxetine (STRATTERA) 40 MG capsule, Take 1 capsule (40 mg total) by mouth daily., Disp: 30 capsule, Rfl: 1   clonazePAM (KLONOPIN) 0.5 MG tablet, Take 0.5-1 tablets (0.25-0.5 mg total) by mouth 2 (two) times daily as needed for anxiety., Disp: 30 tablet, Rfl: 0 Medication Side Effects: none  Family Medical/ Social History: Changes?  Getting married this weekend!  MENTAL HEALTH EXAM:  There were no vitals taken for this visit.There is no height or weight on file to calculate BMI.  General Appearance: Casual and Well Groomed  Eye Contact:  Good  Speech:  Clear and Coherent and Normal Rate  Volume:  Normal  Mood:  Euthymic  Affect:  Appropriate  Thought Process:  Goal Directed and Descriptions of Associations: Circumstantial  Orientation:  Full (Time, Place, and Person)  Thought Content: Logical   Suicidal Thoughts:  No  Homicidal Thoughts:  No  Memory:  WNL  Judgement:  Good  Insight:  Good  Psychomotor Activity:  Normal  Concentration:  Concentration: Fair and Attention Span: Fair  Recall:  Good  Fund of Knowledge: Good  Language: Good  Assets:  Desire for  Improvement  ADL's:  Intact  Cognition: WNL  Prognosis:  Good    DIAGNOSES:    ICD-10-CM   1. Attention deficit hyperactivity disorder (ADHD), combined type  F90.2     2. Generalized anxiety disorder  F41.1      Receiving Psychotherapy: No    RECOMMENDATIONS:  PDMP was reviewed.  No results available. I provided 20 minutes of face to face time during this encounter, including time spent before and after the visit in records review, medical  decision making, counseling pertinent to today's visit, and charting.  We could increase the Strattera a little more but we both agree that with all the changes in her life right now it is difficult to know whether or not the Strattera is effective.  We agree to leave everything the same for now.  When she is due for Strattera next month she can call and let me know if she feels that we need to increase the dose up to 60 mg.  She verbalizes understanding.  Continue Adderall IR 10 mg, 1/4-1/2 p.o. every morning and may repeat at lunch time or so. Continue Strattera 40 mg, 1 p.o. every morning. Continue Klonopin 0.5 mg, 1/2-1 p.o. twice daily as needed.  Do not take at the same time she takes the Adderall. Return in 6 weeks.   Melony Overly, PA-C

## 2022-05-18 ENCOUNTER — Other Ambulatory Visit: Payer: Managed Care, Other (non HMO)

## 2022-05-18 ENCOUNTER — Other Ambulatory Visit: Payer: Self-pay | Admitting: *Deleted

## 2022-05-18 DIAGNOSIS — D649 Anemia, unspecified: Secondary | ICD-10-CM

## 2022-05-19 ENCOUNTER — Telehealth: Payer: Self-pay | Admitting: *Deleted

## 2022-05-19 DIAGNOSIS — R790 Abnormal level of blood mineral: Secondary | ICD-10-CM

## 2022-05-19 LAB — CBC
HCT: 39.4 % (ref 35.0–45.0)
Hemoglobin: 12.8 g/dL (ref 11.7–15.5)
MCH: 29.8 pg (ref 27.0–33.0)
MCHC: 32.5 g/dL (ref 32.0–36.0)
MCV: 91.6 fL (ref 80.0–100.0)
MPV: 10.9 fL (ref 7.5–12.5)
Platelets: 289 10*3/uL (ref 140–400)
RBC: 4.3 10*6/uL (ref 3.80–5.10)
RDW: 15.5 % — ABNORMAL HIGH (ref 11.0–15.0)
WBC: 5.9 10*3/uL (ref 3.8–10.8)

## 2022-05-19 LAB — FERRITIN: Ferritin: 13 ng/mL — ABNORMAL LOW (ref 16–154)

## 2022-05-19 NOTE — Telephone Encounter (Signed)
Patient said 2 months ago at her heaviest flow she would change tampon every 3 hours, after her heaviest day it would be every 4/5 hours. Patient said last month she started using menstrual cup. She is taking daily iron 1 pill, if she misses a pill she will take 2 pills the next day.

## 2022-05-19 NOTE — Telephone Encounter (Signed)
-----   Message from Romualdo Bolk, MD sent at 05/19/2022  1:55 PM EDT ----- Please let the patient know that she is no longer anemic, but still has low iron stores. Please ask her how quickly she is saturating a super or ultra tampon? Confirm that she is taking daily iron?

## 2022-05-23 NOTE — Telephone Encounter (Signed)
Her cycles don't sound heavy enough to cause her to have low iron stores or anemia. I would have her continue the iron daily for 3 months and then recheck a CBC and ferritin. It is possible she is just building up her stores since she is no longer anemic. Please order and set up a lab visit.

## 2022-05-23 NOTE — Telephone Encounter (Signed)
Patient informed, lab order placed. Lab appointments scheduled.

## 2022-06-27 ENCOUNTER — Ambulatory Visit (INDEPENDENT_AMBULATORY_CARE_PROVIDER_SITE_OTHER): Payer: 59 | Admitting: Physician Assistant

## 2022-06-27 ENCOUNTER — Encounter: Payer: Self-pay | Admitting: Physician Assistant

## 2022-06-27 DIAGNOSIS — F411 Generalized anxiety disorder: Secondary | ICD-10-CM

## 2022-06-27 DIAGNOSIS — F902 Attention-deficit hyperactivity disorder, combined type: Secondary | ICD-10-CM | POA: Diagnosis not present

## 2022-06-27 DIAGNOSIS — F320 Major depressive disorder, single episode, mild: Secondary | ICD-10-CM | POA: Diagnosis not present

## 2022-06-27 MED ORDER — AMPHETAMINE-DEXTROAMPHETAMINE 10 MG PO TABS: 5.0000 mg | ORAL_TABLET | Freq: Two times a day (BID) | ORAL | 0 refills | Status: DC

## 2022-06-27 MED ORDER — CLONAZEPAM 0.5 MG PO TABS: 0.2500 mg | ORAL_TABLET | Freq: Two times a day (BID) | ORAL | 2 refills | Status: DC | PRN

## 2022-06-27 MED ORDER — CLONAZEPAM 0.5 MG PO TABS: 0.2500 mg | ORAL_TABLET | Freq: Two times a day (BID) | ORAL | 0 refills | Status: DC | PRN

## 2022-06-27 MED ORDER — ATOMOXETINE HCL 40 MG PO CAPS
40.0000 mg | ORAL_CAPSULE | Freq: Every day | ORAL | 1 refills | Status: DC
Start: 2022-06-27 — End: 2023-05-30

## 2022-06-27 MED ORDER — AMPHETAMINE-DEXTROAMPHETAMINE 10 MG PO TABS: ORAL_TABLET | ORAL | 0 refills | Status: DC

## 2022-06-27 NOTE — Progress Notes (Signed)
[facility]  Patient ID: Ana [name],  MRN: [id number]  PCP: Patient, No Pcp Per  Date of Evaluation: 06/27/2022  time spent:20 minutes  Chief Complaint:  Chief Complaint   ADHD; Anxiety; Follow-up    HISTORY/CURRENT STATUS: HPI for routine med check  Doing really well. Got married last month. The past 2 weeks have been a little more anxious, just trying to get settled in her job and married life.  Has needed the Klonopin a little more often lately.  She does not take the Adderall on the day she takes Klonopin.  Not having panic attacks but does just get overwhelmed at times.  Feels that the Strattera and Adderall are helping her focus.  She accidentally missed the Strattera a couple of times and she could tell a huge difference in her ability to focus.   Patient denies loss of interest in usual activities and is able to enjoy things.  Denies decreased energy.  Denies decreased motivation.  Work is going well.  ADLs and personal hygiene are normal.  Appetite has not changed.  Weight is stable.  No extreme sadness, tearfulness, or feelings of hopelessness.  Sleeps well most of the time.  Denies suicidal or homicidal thoughts.  Patient denies increased energy with decreased need for sleep, no increased talkativeness, no racing thoughts, no impulsivity or risky behaviors, no increased spending, no increased libido, no grandiosity, no increased irritability or anger, and no hallucinations.  Denies dizziness, syncope, seizures, numbness, tingling, tremor, tics, unsteady gait, slurred speech, confusion. Denies muscle or joint pain, stiffness, or dystonia.  Individual Medical History/ Review of Systems: Changes? :No   Past medications for mental health diagnoses include: Adderall XR and IR, Strattera,   Allergies: Patient has no known allergies.  Current Medications:  Current Outpatient Medications:    [START ON 08/26/2022] amphetamine-dextroamphetamine (ADDERALL) 10 MG  tablet, Take 0.5-1 tablets (5-10 mg total) by mouth 2 (two) times daily with breakfast and lunch., Disp: 60 tablet, Rfl: 0   [START ON 07/27/2022] amphetamine-dextroamphetamine (ADDERALL) 10 MG tablet, Take 0.5-1 tablets (5-10 mg total) by mouth 2 (two) times daily with breakfast and lunch., Disp: 60 tablet, Rfl: 0   amphetamine-dextroamphetamine (ADDERALL) 10 MG tablet, Take 0.5-1 tablets (5-10 mg total) by mouth 2 (two) times daily with breakfast and lunch., Disp: 60 tablet, Rfl: 0   atomoxetine (STRATTERA) 40 MG capsule, Take 1 capsule (40 mg total) by mouth daily., Disp: 90 capsule, Rfl: 1   clonazePAM (KLONOPIN) 0.5 MG tablet, Take 0.5-1 tablets (0.25-0.5 mg total) by mouth 2 (two) times daily as needed for anxiety., Disp: 30 tablet, Rfl: 2 Medication Side Effects: none  Family Medical/ Social History: Changes?  Got married a month ago.   MENTAL HEALTH EXAM:  There were no vitals taken for this visit.There is no height or weight on file to calculate BMI.  General Appearance: Casual, Well Groomed, and sunburned  Eye Contact:  Good  Speech:  Clear and Coherent and Normal Rate  Volume:  Normal  Mood:  Euthymic  Affect:  Appropriate  Thought Process:  Goal Directed and Descriptions of Associations: Circumstantial  Orientation:  Full (Time, Place, and Person)  Thought Content: Logical   Suicidal Thoughts:  No  Homicidal Thoughts:  No  Memory:  WNL  Judgement:  Good  Insight:  Good  Psychomotor Activity:  Normal  Concentration:  Concentration: Good and Attention Span: Good  Recall:  Good  Fund of Knowledge: Good  Language: Good  Assets:  Desire for  Improvement  ADL's:  Intact  Cognition: WNL  Prognosis:  Good    DIAGNOSES:    ICD-10-CM   1. Generalized anxiety disorder  F41.1     2. 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 Attention deficit hyperactivity disorder (ADHD), combined type  F90.2      Receiving Psychotherapy: No    RECOMMENDATIONS:  PDMP  was reviewed.  No results available. I provided 20 minutes of face to face time during this encounter, including time spent before and after the visit in records review, medical decision making, counseling pertinent to today's visit, and charting.   Congratulations on her marriage! She is doing well so no changes in medications are necessary.  Continue Adderall IR 10 mg, 1/4-1/2 p.o. every morning and may repeat at lunch time or so. Continue Strattera 40 mg, 1 p.o. every morning. Continue Klonopin 0.5 mg, 1/2-1 p.o. twice daily as needed.  Do not take at the same time she takes the Adderall. Return in 6 months.   Melony Overly, PA-C

## 2022-09-07 ENCOUNTER — Other Ambulatory Visit: Payer: Self-pay

## 2022-11-22 ENCOUNTER — Ambulatory Visit: Payer: 59 | Admitting: Physician Assistant

## 2022-12-12 ENCOUNTER — Telehealth: Payer: Self-pay

## 2022-12-12 NOTE — Telephone Encounter (Addendum)
Appointment desk transferred patient to triage voice mail.  She has her AEX scheduled for 12/28/22.  She wants to discuss having the right ovarian cyst removed as she is looking toward planning a family next year.  She asked if you wanted her to have an u/s prior to know how cyst is appearing when discussing surgery.  03/17/2022  2. Right ovarian cyst She has a 6.8 cm simple right ovarian cyst, it has grown slightly in the last 2 years. She has had pain and we have discussed surgery continued surveillance.  -she is considering surgery in the fall, if not I would recommend a f/u U/S next year.

## 2022-12-13 ENCOUNTER — Other Ambulatory Visit: Payer: Self-pay

## 2022-12-13 DIAGNOSIS — N83201 Unspecified ovarian cyst, right side: Secondary | ICD-10-CM

## 2022-12-13 NOTE — Telephone Encounter (Signed)
Order placed and message sent to appt desk to call her and arrange appt.

## 2022-12-13 NOTE — Telephone Encounter (Signed)
Yes, please schedule an ultrasound prior to her visit.

## 2022-12-13 NOTE — Telephone Encounter (Signed)
U/s appt scheduled 12/15/2022.

## 2022-12-15 ENCOUNTER — Encounter: Payer: Self-pay | Admitting: Obstetrics and Gynecology

## 2022-12-15 ENCOUNTER — Ambulatory Visit (INDEPENDENT_AMBULATORY_CARE_PROVIDER_SITE_OTHER): Payer: Managed Care, Other (non HMO)

## 2022-12-15 ENCOUNTER — Ambulatory Visit (INDEPENDENT_AMBULATORY_CARE_PROVIDER_SITE_OTHER): Payer: Managed Care, Other (non HMO) | Admitting: Obstetrics and Gynecology

## 2022-12-15 VITALS — BP 132/64 | HR 66 | Wt 182.0 lb

## 2022-12-15 DIAGNOSIS — Z3169 Encounter for other general counseling and advice on procreation: Secondary | ICD-10-CM

## 2022-12-15 DIAGNOSIS — N9489 Other specified conditions associated with female genital organs and menstrual cycle: Secondary | ICD-10-CM | POA: Diagnosis not present

## 2022-12-15 DIAGNOSIS — N83201 Unspecified ovarian cyst, right side: Secondary | ICD-10-CM

## 2022-12-15 NOTE — Patient Instructions (Addendum)
Diagnostic Laparoscopy Diagnostic laparoscopy is a procedure to diagnose problems in the abdomen. It might be done for a variety of reasons, such as to look for scar tissue, a reason for abdominal pain, an abdominal mass or tumor, or fluid in the abdomen (ascites). This procedure may also be done to remove a tissue sample from the liver to look at under a microscope (biopsy). During the procedure, a thin, flexible tube that has a light and a camera on the end (laparoscope) is inserted through a small incision in the abdomen. The image from the camera is shown on a monitor to help the surgeon see inside the body. Tell a health care provider about: Any allergies you have. All medicines you are taking, including vitamins, herbs, eye drops, creams, and over-the-counter medicines. Any problems you or family members have had with anesthetic medicines. Any blood disorders you have. Any surgeries you have had. Any medical conditions you have. Whether you are pregnant or may be pregnant. What are the risks? Generally, this is a safe procedure. However, problems may occur, including: Infection. Bleeding. Allergic reactions to medicines or dyes. Damage to abdominal structures or organs, such as the intestines, liver, stomach, or spleen. What happens before the procedure? Staying hydrated Follow instructions from your health care provider about hydration, which may include: Up to 2 hours before the procedure - you may continue to drink clear liquids, such as water, clear fruit juice, black coffee, and plain tea.  Eating and drinking restrictions Follow instructions from your health care provider about eating and drinking, which may include: 8 hours before the procedure - stop eating heavy meals or foods, such as meat, fried foods, or fatty foods. 6 hours before the procedure - stop eating light meals or foods, such as toast or cereal. 6 hours before the procedure - stop drinking milk or drinks that  contain milk. 2 hours before the procedure - stop drinking clear liquids. Medicines Ask your health care provider about: Changing or stopping your regular medicines. This is especially important if you are taking diabetes medicines or blood thinners. Taking medicines such as aspirin and ibuprofen. These medicines can thin your blood. Do not take these medicines unless your health care provider tells you to take them. Taking over-the-counter medicines, vitamins, herbs, and supplements. General instructions Ask your health care provider: How your surgery site will be marked. What steps will be taken to help prevent infection. These steps may include: Removing hair at the surgery site. Washing skin with a germ-killing soap. Taking antibiotic medicine. Plan to have a responsible adult take you home from the hospital or clinic. Plan to have a responsible adult care for you for the time you are told after you leave the hospital or clinic. This is important. What happens during the procedure?  An IV will be inserted into one of your veins. You will be given one or more of the following: A medicine to help you relax (sedative). A medicine to numb the area (local anesthetic). A medicine to make you fall asleep (general anesthetic). A breathing tube will be placed down your throat to help you breathe during the procedure. Your abdomen will be filled with an air-like gas so that your abdomen expands. This will give the surgeon more room to operate and will make your organs easier to see. Many small incisions will be made in your abdomen. A laparoscope and other surgical instruments will be inserted into your abdomen through these incisions. A biopsy may be done.   This will depend on the reason why you are having this procedure. The laparoscope and other instruments will be removed from your abdomen. The air-like gas will be released from your abdomen. Your incisions will be closed with stitches  (sutures), skin glue, or surgical tapes and covered with a bandage (dressing). Your breathing tube will be removed. The procedure may vary among health care providers and hospitals. What happens after the procedure? Your blood pressure, heart rate, breathing rate, and blood oxygen level will be monitored until you leave the hospital or clinic. If you were given a sedative during the procedure, it can affect you for several hours. Do not drive or operate machinery until your health care provider says that it is safe. It is up to you to get the results of your procedure. Ask your health care provider, or the department that is doing the procedure, when your results will be ready. Summary Diagnostic laparoscopy is a procedure to diagnose problems in the abdomen using a thin, flexible tube that has a light and a camera on the end (laparoscope). Follow instructions from your health care provider about how to prepare for the procedure. Plan to have a responsible adult care for you for the time you are told after you leave the hospital or clinic. This is important. This information is not intended to replace advice given to you by your health care provider. Make sure you discuss any questions you have with your health care provider. Document Revised: 07/17/2020 Document Reviewed: 07/17/2020 Elsevier Patient Education  [place]. Preparing for Pregnancy If you are planning to become pregnant, talk to your health care provider about preconception care. This type of care helps you prepare for a safe and healthy pregnancy. During this visit, your health care provider will: Do a complete physical exam, including a vaginal exam. Take your complete medical history. Give you information, answer your questions, and address possible problems. What should be on my preconception checklist? Medical history Tell your health care provider about any medical conditions you have or have had. Your pregnancy or  your ability to become pregnant may be affected by long-term (chronic) conditions, such as: Diabetes. High blood pressure (hypertension). Problems with your thyroid. Tell your health care provider about your family's medical history and your partner's medical history. If needed, discuss the benefits of genetic testing. This checks for conditions that may be passed from parent to child. Tell your health care provider if you have or have had any sexually transmitted infections (STIs). These can affect your pregnancy. In some cases, they can pass to your baby. Tell your health care provider about: Any problems you had with previous pregnancies. Any medicines you take. These include vitamins, herbs, supplements, and over-the-counter medicines. Your vaccine history. Discuss any vaccines that you may need. Diet Ask your health care provider what foods to eat to get a balance of nutrients. This is very important when you are pregnant or preparing to get pregnant. Take a folic acid supplement of 400 mcg daily and eat foods rich in folic acid to help prevent certain birth defects. You should also take a prenatal vitamin before pregnancy and keep taking it while pregnant. Ask your health care provider to help you reach a healthy weight before pregnancy. If you are overweight, you may have a higher risk for certain problems. These include having trouble getting pregnant (infertility), hypertension, diabetes, and early (preterm) birth. If you are underweight, you are more likely to have a baby who has  a low birth weight. Lifestyle, work, and home Let your health care provider know about: Use of alcohol, illegal drugs, or tobacco. These products can cause serious problems for your pregnancy and your baby. Fun and leisure activities that may put you at risk during pregnancy. These may include skiing and certain exercise programs. Any plans to travel out of the country, especially to places with an active  outbreak of [place] virus. Harmful substances that you may be exposed to at work or home. These include chemicals, pesticides, radiation, and substances from cat litter boxes. Any concerns you have for your safety at home. Mental health Tell your health care provider about: Any history of mental health conditions, including feelings of depression, sadness, or anxiety. Any thoughts of suicide or previous attempts of suicide. Any medicines that you take for a mental health condition. These include herbs and supplements. How do I know that I am pregnant? You may be pregnant if you have been sexually active, and you miss your period. Other symptoms of early pregnancy may include: Mild cramping. Feeling more tired than usual. Nausea and vomiting. These may be signs of morning sickness. Breast enlargement and tenderness. Take a home pregnancy test if you have any of these symptoms. This test checks for a hormone in your urine called human chorionic gonadotropin, or hCG. Your body begins to make this hormone during early pregnancy. Wait until at least the first day after you miss your period to take a test. What should I do if I become pregnant? Schedule a visit with your health care provider as soon as you suspect you are pregnant. Talk to your health care provider if you are taking medicines to see if they are safe to take during pregnancy. Follow these instructions at home: Eating and drinking  Follow instructions from your health care provider about what you may eat and drink. Drink enough fluids to keep your urine pale yellow. Eat a balanced diet. This includes fresh fruits and vegetables, whole grains, lean meats, low-fat dairy products, healthy fats, and foods that are high in fiber. Wash all fresh fruits and vegetables before eating. Ask to meet with a nutritionist or registered dietitian for help with meal planning and goals. Avoid eating raw or undercooked meat and seafood. Avoid eating  or drinking unpasteurized dairy products. Heat all processed foods to 165F. This includes precooked or cured meat, such as sausages or meat loaves. Lifestyle     Get regular exercise. Try to be active for at least 30 minutes a day on most days of the week. Ask your health care provider which activities are safe during pregnancy. Ask your health care provider about ways to manage stress. Maintain a healthy weight. Avoid toxic fumes and chemicals. Avoid cleaning cat litter boxes. Cat stool (feces) may contain a harmful parasite called toxoplasma. Avoid travel to countries where [place] virus is common. Do not use any products that contain nicotine or tobacco. These products include cigarettes, chewing tobacco, and vaping devices, such as e-cigarettes. If you need help quitting, ask your health care provider. Do not drink alcohol or use drugs. General instructions Keep an accurate record of your menstrual periods. This makes it easier for your health care provider to determine your baby's due date. Take over-the-counter and prescription medicines only as told by your health care provider. Manage any chronic conditions, such as hypertension and diabetes, as told by your health care provider. This information is not intended to replace advice given to you by your  health care provider. Make sure you discuss any questions you have with your health care provider. Document Revised: 04/13/2022 Document Reviewed: 04/13/2022 Elsevier Patient Education  [place].

## 2022-12-15 NOTE — Progress Notes (Signed)
GYNECOLOGY  VISIT   HPI: 37 y.o.   Single White or Caucasian Not Hispanic or Latino  female   [id number] with No LMP recorded.   here for a f/u U/S. She has a h/o a a larger, simple right ovarian cyst. In 12/20 it was 6.3 x 5.2 cm, in 3/23 it was 6.82 x 5.47 cm. She was offered surgery or surveillance and is now considering surgery.   Cycles are every 3-6 weeks x 4-5 days. She uses a menstrual cup, empties it every 2-3 hours (thinks she would saturate a super tampon in 2-3 hours). Mild cramps. No BTB.   No pelvic pain.   Just got married, would like to get pregnant. They have been having unprotected intercourse since June of 2023.   LMP was 12/04/22.  No FH genetic or chromosomal abnormalities. No h/o pelvic infections/STD She has had chicken pox.   GYNECOLOGIC HISTORY: No LMP recorded. Contraception:none  Menopausal hormone therapy: none         OB History     Gravida  0   Para  0   Term  0   Preterm  0   AB  0   Living  0      SAB  0   IAB  0   Ectopic  0   Multiple  0   Live Births  0              Patient Active Problem List   Diagnosis Date Noted   Attention deficit hyperactivity disorder (ADHD), combined type 04/21/2021   PCOS (polycystic ovarian syndrome) 01/14/2019    Past Medical History:  Diagnosis Date   Anxiety    Infertility, female    Irregular menstrual cycle    PCOS (polycystic ovarian syndrome)     No past surgical history on file.  Current Outpatient Medications  Medication Sig Dispense Refill   amphetamine-dextroamphetamine (ADDERALL) 10 MG tablet Take 0.5-1 tablets (5-10 mg total) by mouth 2 (two) times daily with breakfast and lunch. 60 tablet 0   amphetamine-dextroamphetamine (ADDERALL) 10 MG tablet Take 0.5-1 tablets (5-10 mg total) by mouth 2 (two) times daily with breakfast and lunch. 60 tablet 0   amphetamine-dextroamphetamine (ADDERALL) 10 MG tablet Take 0.5-1 tablets (5-10 mg total) by mouth 2 (two) times daily with  breakfast and lunch. 60 tablet 0   atomoxetine (STRATTERA) 40 MG capsule Take 1 capsule (40 mg total) by mouth daily. 90 capsule 1   clonazePAM (KLONOPIN) 0.5 MG tablet Take 0.5-1 tablets (0.25-0.5 mg total) by mouth 2 (two) times daily as needed for anxiety. 30 tablet 2   No current facility-administered medications for this visit.     ALLERGIES: Patient has no known allergies.  Family History  Problem Relation Age of Onset   Thyroid disease Mother    Hypertension Sister    Thyroid disease Sister    Stroke Maternal Grandmother    Heart failure Maternal Grandmother    Prostate cancer Maternal Grandfather    Diabetes Maternal Grandfather     Social History   Socioeconomic History   Marital status: Single    Spouse name: Not on file   Number of children: Not on file   Years of education: Not on file   Highest education level: Not on file  Occupational History   Not on file  Tobacco Use   Smoking status: Never   Smokeless tobacco: Never  Vaping Use   Vaping Use: Never used  Substance and Sexual Activity  Alcohol use: Yes    Comment: rare   Drug use: No   Sexual activity: Yes    Partners: Male    Birth control/protection: None  Other Topics Concern   Not on file  Social History Narrative   Not on file   Social Determinants of Health   Financial Resource Strain: Not on file  Food Insecurity: Not on file  Transportation Needs: Not on file  Physical Activity: Not on file  Stress: Not on file  Social Connections: Not on file  Intimate Partner Violence: Not on file    Review of Systems  All other systems reviewed and are negative.   PHYSICAL EXAMINATION:    There were no vitals taken for this visit.    General appearance: alert, cooperative and appears stated age  Pelvic ultrasound  Indications: f/u right ovarian cyst  Findings:  Uterus 8.89 x 5.31 x 4.22 cm, anteverted  Endometrium 8.9 mm 1.3 x 0.7 cm echogenic intracavitary mass with a large  feeder vessel. Confirmed with 3D imaging.   Left ovary 4.26 x 2.92 x 3.03 cm  Right ovary 7.71 x 7.36 x 5.77 cm Simple cyst 7.4 x 7.2 x 5.7 cm (increased since prior u/s), + perfusion to surrounding ovarian tissue  No free fluid   Impression:  Normal sized uterus Asymmetrically thickened endometrium with 1.3 cm intracavitary mass Normal left ovary Increased size of simple right ovarian cyst, not 7.4 cm in max diameter  1. Right ovarian cyst Slowly enlarging, now 7.4 cm. Discussed options of surgical removal and continued surveillance. Given continued growth, risk of torsion and desired fertility, recommended removal.  Will refer to pelvic surgeon  2. Endometrial mass Given plans for pregnancy and plan for laparoscopy we discussed hysteroscopic removal  3. Infertility counseling She has been having unprotected intercourse since 6/23, wondering about her fertility Will check cycle D#3 labs BBT charts  4. Encounter for preconception consultation Information given Will check her Rubella H/O chicken pox Start PNV

## 2022-12-16 ENCOUNTER — Telehealth: Payer: Self-pay | Admitting: *Deleted

## 2022-12-16 DIAGNOSIS — N83201 Unspecified ovarian cyst, right side: Secondary | ICD-10-CM

## 2022-12-16 DIAGNOSIS — N9489 Other specified conditions associated with female genital organs and menstrual cycle: Secondary | ICD-10-CM

## 2022-12-16 NOTE — Telephone Encounter (Signed)
Referral placed to Dr. Sabra Heck. Routing to [place] to f/u on referral.

## 2022-12-16 NOTE — Telephone Encounter (Signed)
-----  Message from Salvadore Dom, MD sent at 12/15/2022  1:47 PM EST ----- Please see who you can get this patient in with first, Sabra Heck or [place]. She needs laparoscopic removal of a 7.4 cm right ovarian cyst and hysteroscopy, D&C for a polyp. Thanks, Sharee Pimple

## 2022-12-23 NOTE — Telephone Encounter (Signed)
Pt inquiring if needing to keep appt on 12/28/22 re: discussing her cyst. Was seen already on 12/15/22. I just wanted you to confirm before I cancelled. Thanks.

## 2022-12-28 ENCOUNTER — Ambulatory Visit: Payer: Managed Care, Other (non HMO) | Admitting: Obstetrics and Gynecology

## 2023-01-06 ENCOUNTER — Telehealth: Payer: Self-pay | Admitting: *Deleted

## 2023-01-06 NOTE — Telephone Encounter (Signed)
Patient left message on appointment line at 11:59 PM requesting return call.  Call returned to patient. LMP 01/06/23, spotting.  Calling to schedule Day 3 labs.  Advised our office is closed, no provider in office. Weekend labs have to be drawn at [facility] and require written order. Advised I will contact lab to see if any alternative. Advised labs may need to be completed with next cycle.   Call placed to [facility], was advise written order required.   Call returned to patient. Patient agreeable to schedule with next menses. Advised I will complete order in advance and update Dr. Talbert Nan. Will return call on Monday with any additional recommendations. Patient agreeable.  Patient is scheduled to see Dr. Sabra Heck for surgery consult on 01/16/23.   Dr. Talbert Nan -please review. OK to schedule with next menses?

## 2023-01-09 NOTE — Telephone Encounter (Signed)
Yes, of course

## 2023-01-09 NOTE — Telephone Encounter (Signed)
Spoke with patient.  Patient will call on day 1 of next menses.  Signed order to [facility] Triage to have if Day 3 labs occur on the weekend and labs needed at [facility].  Patient verbalizes understanding and is appreciative of call.   Routing to provider for final review. Patient is agreeable to disposition. Will close encounter.  Cc: [facility] Triage

## 2023-01-10 NOTE — Telephone Encounter (Signed)
Per review of EPIC, patient scheduled with Dr. Sabra Heck on 01/16/23.   Routing to Dr. Rosann Auerbach.   Encounter closed.   Cc: [place]

## 2023-01-16 ENCOUNTER — Encounter (HOSPITAL_BASED_OUTPATIENT_CLINIC_OR_DEPARTMENT_OTHER): Payer: Self-pay | Admitting: Obstetrics & Gynecology

## 2023-01-16 ENCOUNTER — Ambulatory Visit (HOSPITAL_BASED_OUTPATIENT_CLINIC_OR_DEPARTMENT_OTHER): Payer: Managed Care, Other (non HMO) | Admitting: Obstetrics & Gynecology

## 2023-01-16 VITALS — BP 117/73 | HR 80 | Ht 68.5 in | Wt 183.4 lb

## 2023-01-16 DIAGNOSIS — N83201 Unspecified ovarian cyst, right side: Secondary | ICD-10-CM

## 2023-01-19 DIAGNOSIS — N83201 Unspecified ovarian cyst, right side: Secondary | ICD-10-CM | POA: Insufficient documentation

## 2023-01-19 NOTE — Progress Notes (Signed)
GYNECOLOGY  VISIT  CC:   surgical consult  HPI: 37 y.o. G0P0000 Single White or Caucasian female here for discussion of gradually enlarging right ovarian cyst.  In 2020, this was 6.1cm and measured 7.4cm on 12/15/2022.  She has undergone several ultrasounds.  Images reviewed with pt today from 12/15/2022.  Personally reviewed images from 2020 and 2023.  She denies pain.  She is interested in child bearing.    She is interested in discussing options for treatment.  Has been advised it may be possible to remove the cyst and save the ovary.  Reviewed images with pt and feel that may not be possible.  Also, fallopian tube is likely elongated due to size of ovary.  She is concerned about possible changes with fertility if has ovary and possible removed.    She is clearly aware there is a risk for ovarian torsion and she understands this would be a surgical emergency, even if pregnant.  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  She does ask about draining cyst.  Spilling fluid content during surgery and associated risks discussed.  Pt understands I would strive to remove ovary/cyst intact and therefore removal of just cyst could be difficult.    Questions answered.     Past Medical History:  Diagnosis Date   Anxiety    Infertility, female    Irregular menstrual cycle    PCOS (polycystic ovarian syndrome)     MEDS:   Current Outpatient Medications on File Prior to Visit  Medication Sig Dispense Refill   clonazePAM (KLONOPIN) 0.5 MG tablet Take 0.5-1 tablets (0.25-0.5 mg total) by mouth 2 (two) times daily as needed for anxiety. 30 tablet 2   amphetamine-dextroamphetamine (ADDERALL) 10 MG tablet Take 0.5-1 tablets (5-10 mg total) by mouth 2 (two) times daily with breakfast and lunch. (Patient not taking: Reported on 01/16/2023) 60 tablet 0   amphetamine-dextroamphetamine (ADDERALL) 10 MG tablet Take 0.5-1 tablets (5-10 mg  total) by mouth 2 (two) times daily with breakfast and lunch. (Patient not taking: Reported on 01/16/2023) 60 tablet 0   amphetamine-dextroamphetamine (ADDERALL) 10 MG tablet Take 0.5-1 tablets (5-10 mg total) by mouth 2 (two) times daily with breakfast and lunch. (Patient not taking: Reported on 01/16/2023) 60 tablet 0   atomoxetine (STRATTERA) 40 MG capsule Take 1 capsule (40 mg total) by mouth daily. (Patient not taking: Reported on 01/16/2023) 90 capsule 1   No current facility-administered medications on file prior to visit.    ALLERGIES: Patient has no known allergies.  SH:  single, non smoker  Review of Systems  Constitutional: Negative.   Genitourinary: Negative.     PHYSICAL EXAMINATION:    BP 117/73   Pulse 80   Ht 5' 8.5" (1.74 m)   Wt 183 lb 6.4 oz (83.2 kg)   BMI 27.48 kg/m     Physical Exam Constitutional:      Appearance: Normal appearance.  Neurological:     General: No focal deficit present.     Mental Status: She is alert.  Psychiatric:        Mood and Affect: Mood normal.     Assessment/Plan: 1. Right ovarian cyst - pros/cons of removal of right ovarian cyst/ovary/fallopian tube discussed.  Feel it is likely I cannot just removal the ovarian cyst or that it will possibly regrow.  Also, risks of abnormal pathology (although very low) would not be know until  pathology results are know. - will discuss with Dr. Kerin Perna if there is any decreased fertility if this was not surgically treated and can communicate back with pt.  She is currently leaning towards trying for pregnancy and not treating this surgically at this time. - if decides to just follow, will recommend testing ca-125 and proceeding with fertility testing as well.  Total time with pt: 26 minutes. Documentation: 6 minutes.  Total time 32 minutes

## 2023-01-23 ENCOUNTER — Encounter: Payer: Self-pay | Admitting: *Deleted

## 2023-02-09 ENCOUNTER — Encounter (HOSPITAL_BASED_OUTPATIENT_CLINIC_OR_DEPARTMENT_OTHER): Payer: Self-pay | Admitting: Obstetrics & Gynecology

## 2023-03-10 ENCOUNTER — Other Ambulatory Visit: Payer: Managed Care, Other (non HMO)

## 2023-03-10 DIAGNOSIS — Z3169 Encounter for other general counseling and advice on procreation: Secondary | ICD-10-CM

## 2023-03-14 LAB — ESTRADIOL: Estradiol: 28 pg/mL

## 2023-03-14 LAB — ANTI-MULLERIAN HORMONE (AMH), FEMALE: Anti-Mullerian Hormones(AMH), Female: 8.67 ng/mL (ref 0.36–10.07)

## 2023-03-14 LAB — FOLLICLE STIMULATING HORMONE: FSH: 7.4 m[IU]/mL

## 2023-03-14 LAB — RUBELLA SCREEN: Rubella: 1.32 Index

## 2023-03-15 ENCOUNTER — Encounter (HOSPITAL_BASED_OUTPATIENT_CLINIC_OR_DEPARTMENT_OTHER): Payer: Self-pay | Admitting: Obstetrics & Gynecology

## 2023-03-20 ENCOUNTER — Ambulatory Visit: Payer: Managed Care, Other (non HMO) | Admitting: Obstetrics and Gynecology

## 2023-03-24 ENCOUNTER — Encounter: Payer: Self-pay | Admitting: Obstetrics and Gynecology

## 2023-04-10 ENCOUNTER — Telehealth: Payer: Self-pay

## 2023-04-10 NOTE — Telephone Encounter (Signed)
Patient had contacted Dr. Oscar La in [facility] Chart but Dr. Salli Quarry reply was unread. I called patient today and read it to her and discussed PCP recommendations.  "    Hi [name], Typically family practice providers would handle warts, a Dermatologist would be another option.  You can go to [email or web address] to make an appointment with primary care. You can also try some of the over the counter products, just ask the pharmacist if you can't locate them. I hope this is helpful. Have a lovely weekend! Gertie Exon

## 2023-04-20 ENCOUNTER — Encounter (HOSPITAL_BASED_OUTPATIENT_CLINIC_OR_DEPARTMENT_OTHER): Payer: Self-pay | Admitting: Obstetrics & Gynecology

## 2023-04-28 ENCOUNTER — Telehealth: Payer: Self-pay

## 2023-04-28 NOTE — Telephone Encounter (Signed)
Contacted patient to discuss surgery dates w/ Dr. Hyacinth Meeker. Patient chose to have procedure on 06/28/23 at 2:30pm @[facility] . Provided patient w/ date, time, location and Pre-Op instructions. Patient is aware she must arrive at 12:30 pm.

## 2023-05-02 ENCOUNTER — Other Ambulatory Visit (HOSPITAL_BASED_OUTPATIENT_CLINIC_OR_DEPARTMENT_OTHER): Payer: Self-pay | Admitting: Obstetrics & Gynecology

## 2023-05-02 DIAGNOSIS — Z01818 Encounter for other preprocedural examination: Secondary | ICD-10-CM

## 2023-05-04 ENCOUNTER — Encounter (HOSPITAL_BASED_OUTPATIENT_CLINIC_OR_DEPARTMENT_OTHER): Payer: Self-pay

## 2023-05-18 ENCOUNTER — Other Ambulatory Visit (HOSPITAL_BASED_OUTPATIENT_CLINIC_OR_DEPARTMENT_OTHER): Payer: Self-pay | Admitting: *Deleted

## 2023-05-18 NOTE — Progress Notes (Signed)
Opened in error

## 2023-05-22 NOTE — Progress Notes (Signed)
37 y.o. G0P0000 Single White or Caucasian Not Hispanic or Latino female here for annual exam.   Period Cycle (Days): 35 Period Duration (Days): 5 Period Pattern: Regular Menstrual Flow: Moderate Menstrual Control: Tampon (cup) Menstrual Control Change Freq (Hours): 2 Dysmenorrhea: None Dysmenorrhea Symptoms: Nausea  She has a 7+ cm right ovarian cyst that has been followed. She has a consultation with Dr Hyacinth Meeker and has decided to follow it for now (as opposed to surgery).  She has an appointment with Dr April Manson to discuss fertility. She has not used contraception for years and hasn't gotten pregnant.   She has a hysteroscopic polypectomy scheduled next month.   Patient's last menstrual period was 05/18/2023.          Sexually active: Yes.    The current method of family planning is none.    Exercising: No.  The patient does not participate in regular exercise at present. Smoker:  no  Health Maintenance: Pap: 03/17/22 wnl HR HPV NEG , 12/08/17 WNL   History of abnormal Pap:  no MMG:  n/a BMD:   n/a Colonoscopy:none   TDaP:  03/17/22 Gardasil: none, declines     reports that she has never smoked. She has never used smokeless tobacco. She reports current alcohol use. She reports that she does not use drugs. Just occasional ETOH.   Past Medical History:  Diagnosis Date   Anxiety    Infertility, female    Irregular menstrual cycle    PCOS (polycystic ovarian syndrome)     History reviewed. No pertinent surgical history.  No current outpatient medications on file.   No current facility-administered medications for this visit.    Family History  Problem Relation Age of Onset   Thyroid disease Mother    Hypertension Sister    Thyroid disease Sister    Stroke Maternal Grandmother    Heart failure Maternal Grandmother    Prostate cancer Maternal Grandfather    Diabetes Maternal Grandfather     Review of Systems  All other systems reviewed and are negative.   Exam:    BP 112/68   Pulse 68   Ht 5\' 8"  (1.727 m)   Wt 183 lb (83 kg)   LMP 05/18/2023   SpO2 100%   BMI 27.83 kg/m   Weight change: @WEIGHTCHANGE @ Height:   Height: 5\' 8"  (172.7 cm)  Ht Readings from Last 3 Encounters:  05/30/23 5\' 8"  (1.727 m)  01/16/23 5' 8.5" (1.74 m)  03/17/22 5' 8.5" (1.74 m)    General appearance: alert, cooperative and appears stated age Head: Normocephalic, without obvious abnormality, atraumatic Neck: no adenopathy, supple, symmetrical, trachea midline and thyroid normal to inspection and palpation Lungs: clear to auscultation bilaterally Cardiovascular: regular rate and rhythm Breasts: normal appearance, no masses or tenderness Abdomen: soft, non-tender; non distended,  no masses,  no organomegaly Extremities: extremities normal, atraumatic, no cyanosis or edema Skin: Skin color, texture, turgor normal. No rashes or lesions Lymph nodes: Cervical, supraclavicular, and axillary nodes normal. No abnormal inguinal nodes palpated Neurologic: Grossly normal   Pelvic: External genitalia:  no lesions              Urethra:  normal appearing urethra with no masses, tenderness or lesions              Bartholins and Skenes: normal                 Vagina: normal appearing vagina with normal color and discharge, no lesions  Cervix: no lesions               Bimanual Exam:  Uterus:  normal size, contour, position, consistency, mobility, non-tender and anteverted              Adnexa:  large mass noted on the right, not mobile, not tender               Rectovaginal: Confirms               Anus:  normal sphincter tone, no lesions  Carolynn Serve, CMA chaperoned for the exam.  1. Well woman exam Discussed breast self exam Discussed calcium and vit D intake No pap this year No screening labs this year  2. Right ovarian cyst She has consulted with Dr Hyacinth Meeker about removal vs surveillance. She will be getting a f/u ultrasound with Dr Hyacinth Meeker, due next month (she  will contact her office to get it scheduled).   3. Infertility counseling She is following up with Dr April Manson in 8/24. On PNV  CC: Dr Hyacinth Meeker

## 2023-05-30 ENCOUNTER — Encounter: Payer: Self-pay | Admitting: Obstetrics and Gynecology

## 2023-05-30 ENCOUNTER — Ambulatory Visit (INDEPENDENT_AMBULATORY_CARE_PROVIDER_SITE_OTHER): Payer: Managed Care, Other (non HMO) | Admitting: Obstetrics and Gynecology

## 2023-05-30 ENCOUNTER — Ambulatory Visit: Payer: Managed Care, Other (non HMO) | Admitting: Obstetrics and Gynecology

## 2023-05-30 VITALS — BP 112/68 | HR 68 | Ht 68.0 in | Wt 183.0 lb

## 2023-05-30 DIAGNOSIS — Z01419 Encounter for gynecological examination (general) (routine) without abnormal findings: Secondary | ICD-10-CM

## 2023-05-30 DIAGNOSIS — N83201 Unspecified ovarian cyst, right side: Secondary | ICD-10-CM | POA: Diagnosis not present

## 2023-05-30 DIAGNOSIS — Z3169 Encounter for other general counseling and advice on procreation: Secondary | ICD-10-CM | POA: Diagnosis not present

## 2023-05-30 NOTE — Patient Instructions (Signed)

## 2023-05-31 ENCOUNTER — Other Ambulatory Visit (HOSPITAL_BASED_OUTPATIENT_CLINIC_OR_DEPARTMENT_OTHER): Payer: Self-pay | Admitting: Obstetrics & Gynecology

## 2023-06-06 ENCOUNTER — Encounter (HOSPITAL_BASED_OUTPATIENT_CLINIC_OR_DEPARTMENT_OTHER): Payer: Self-pay | Admitting: Obstetrics & Gynecology

## 2023-06-06 NOTE — Progress Notes (Addendum)
Spoke w/ via phone for pre-op interview--- Ana [name] needs dos----   CBC and HCG (serum qual) per surgeon           [name] results------ COVID test -----patient states asymptomatic no test needed Arrive at -------1230 NPO after MN NO Solid Food.  Clear liquids from MN until---1130 Med rec completed Medications to take morning of surgery -----NONE Diabetic medication ----- Patient instructed no nail polish to be worn day of surgery Patient instructed to bring photo id and insurance card day of surgery Patient aware to have Driver (ride ) / caregiver Husband Ana [name]   for 24 hours after surgery  Patient Special Instructions ----- Pre-Op special Instructions ----- Patient verbalized understanding of instructions that were given at this phone interview. Patient denies shortness of breath, chest pain, fever, cough at this phone interview.

## 2023-06-19 ENCOUNTER — Telehealth: Payer: Self-pay

## 2023-06-19 NOTE — Telephone Encounter (Signed)
Called patient to advise the time has changed for her upcoming procedure on 06/28/23. The time is now 7:30 am and patient has to arrive by 5:30 am. Patient was originally scheduled for the afternoon. No answer, left time details on voicemail & notified pt via Mychart. Sending a letter to pt via [place] mail as well.

## 2023-06-26 ENCOUNTER — Ambulatory Visit (HOSPITAL_BASED_OUTPATIENT_CLINIC_OR_DEPARTMENT_OTHER)
Admission: RE | Admit: 2023-06-26 | Discharge: 2023-06-26 | Disposition: A | Discharge status: Home / Self Care | Payer: Managed Care, Other (non HMO) | Source: Ambulatory Visit | Attending: Obstetrics & Gynecology | Admitting: Obstetrics & Gynecology

## 2023-06-26 ENCOUNTER — Other Ambulatory Visit (HOSPITAL_BASED_OUTPATIENT_CLINIC_OR_DEPARTMENT_OTHER): Payer: Self-pay | Admitting: Obstetrics & Gynecology

## 2023-06-26 DIAGNOSIS — N83201 Unspecified ovarian cyst, right side: Secondary | ICD-10-CM | POA: Diagnosis not present

## 2023-06-27 ENCOUNTER — Encounter (HOSPITAL_BASED_OUTPATIENT_CLINIC_OR_DEPARTMENT_OTHER): Payer: Self-pay | Admitting: Obstetrics & Gynecology

## 2023-06-28 ENCOUNTER — Ambulatory Visit (HOSPITAL_BASED_OUTPATIENT_CLINIC_OR_DEPARTMENT_OTHER)
Admission: RE | Admit: 2023-06-28 | Payer: Managed Care, Other (non HMO) | Source: Home / Self Care | Admitting: Obstetrics & Gynecology

## 2023-06-28 SURGERY — DILATATION AND CURETTAGE /HYSTEROSCOPY
Anesthesia: Choice

## 2023-07-26 ENCOUNTER — Encounter (HOSPITAL_BASED_OUTPATIENT_CLINIC_OR_DEPARTMENT_OTHER): Payer: Self-pay | Admitting: Obstetrics & Gynecology

## 2023-08-11 ENCOUNTER — Encounter (HOSPITAL_BASED_OUTPATIENT_CLINIC_OR_DEPARTMENT_OTHER): Payer: Self-pay | Admitting: Obstetrics & Gynecology

## 2023-08-11 ENCOUNTER — Ambulatory Visit: Payer: Managed Care, Other (non HMO) | Admitting: Family

## 2023-08-11 ENCOUNTER — Encounter: Payer: Self-pay | Admitting: Family

## 2023-08-11 VITALS — BP 103/69 | HR 75 | Temp 98.5°F | Ht 68.75 in | Wt 185.2 lb

## 2023-08-11 DIAGNOSIS — Z7689 Persons encountering health services in other specified circumstances: Secondary | ICD-10-CM | POA: Diagnosis not present

## 2023-08-11 DIAGNOSIS — B079 Viral wart, unspecified: Secondary | ICD-10-CM

## 2023-08-11 NOTE — Telephone Encounter (Signed)
Called pt to recommend that she have a myChart visit to discuss the form that she would like to have filled out. Appt provided.

## 2023-08-11 NOTE — Patient Instructions (Signed)
Thank you for choosing Primary Care at [facility] for your medical home!    Ana [name] was seen by Rema Fendt, NP today.   Bluford Kaufmann primary care provider is Rema Fendt, NP.   For the best care possible,  you should try to see Ricky Stabs, NP whenever you come to office.   We look forward to seeing you again soon!  If you have any questions about your visit today,  please call us at [phone]  Or feel free to reach your provider via MyChart.   Keeping you healthy   Get these tests Blood pressure- Have your blood pressure checked once a year by your healthcare provider.  Normal blood pressure is 120/80. Weight- Have your body mass index (BMI) calculated to screen for obesity.  BMI is a measure of body fat based on height and weight. You can also calculate your own BMI at https://www.west-esparza.com/. Cholesterol- Have your cholesterol checked regularly starting at age 73, sooner may be necessary if you have diabetes, high blood pressure, if a family member developed heart diseases at an early age or if you smoke.  Chlamydia, HIV, and other sexual transmitted disease- Get screened each year until the age of 64 then within three months of each new sexual partner. Diabetes- Have your blood sugar checked regularly if you have high blood pressure, high cholesterol, a family history of diabetes or if you are overweight.   Get these vaccines Flu shot- Every fall. Tetanus shot- Every 10 years. Menactra- Single dose; prevents meningitis.   Take these steps Don't smoke- If you do smoke, ask your healthcare provider about quitting. For tips on how to quit, go to www.smokefree.gov or call 1-800-QUIT-NOW. Be physically active- Exercise 5 days a week for at least 30 minutes.  If you are not already physically active start slow and gradually work up to 30 minutes of moderate physical activity.  Examples of moderate activity include walking briskly, mowing the yard, dancing,  swimming bicycling, etc. Eat a healthy diet- Eat a variety of healthy foods such as fruits, vegetables, low fat milk, low fat cheese, yogurt, lean meats, poultry, fish, beans, tofu, etc.  For more information on healthy eating, go to www.thenutritionsource.org Drink alcohol in moderation- Limit alcohol intake two drinks or less a day.  Never drink and drive. Dentist- Brush and floss teeth twice daily; visit your dentis twice a year. Depression-Your emotional health is as important as your physical health.  If you're feeling down, losing interest in things you normally enjoy please talk with your healthcare provider. Gun Safety- If you keep a gun in your home, keep it unloaded and with the safety lock on.  Bullets should be stored separately. Helmet use- Always wear a helmet when riding a motorcycle, bicycle, rollerblading or skateboarding. Safe sex- If you may be exposed to a sexually transmitted infection, use a condom Seat belts- Seat bels can save your life; always wear one. Smoke/Carbon Monoxide detectors- These detectors need to be installed on the appropriate level of your home.  Replace batteries at least once a year. Skin Cancer- When out in the sun, cover up and use sunscreen SPF 15 or higher. Violence- If anyone is threatening or hurting you, please tell your healthcare provider.

## 2023-08-11 NOTE — Progress Notes (Signed)
Pt states a few warts on her hands she wants to have looked at.

## 2023-08-11 NOTE — Progress Notes (Signed)
Subjective:    Ana [name] - 37 y.o. female MRN [id number]  Date of birth: 04-01-1986  HPI  Ana [name] is to establish care.   Current issues and/or concerns: - States established with Psychiatry and a therapist for management of past relationship issues and stress. She denies thoughts of self-harm, suicidal ideations, homicidal ideations. - Established with Gynecology for management of women's health maintenance.  - Reports warts of bilateral hands. No red flag symptoms. States thinks related to frequently gardening. She would like referral to specialist.  - Patient presents today with [facility] form for completion. I discussed with patient in detail I will be unable to complete requested form on today due to our office policy that patient has not been established with our office for 90 days. I discussed with patient in detail that she may want to consider if her psychiatrist/therapist or former primary care provider can review form and complete if appropriate. Patient verbalized understanding/agreement. Please note I consulted with Georganna Skeans, MD. - No further issues/concerns for discussion today.  ROS per HPI     Health Maintenance:  Health Maintenance Due  Topic Date Due   INFLUENZA VACCINE  Never done   COVID-19 Vaccine (1 - 2023-24 season) Never done     Past Medical History: Patient Active Problem List   Diagnosis Date Noted   Right ovarian cyst 01/19/2023   Attention deficit hyperactivity disorder (ADHD), combined type 04/21/2021   PCOS (polycystic ovarian syndrome) 01/14/2019      Social History   reports that she has never smoked. She has never used smokeless tobacco. She reports current alcohol use. She reports that she does not use drugs.   Family History  family history includes Diabetes in her maternal grandfather; Heart failure in her maternal grandmother; Hypertension in her sister; Prostate cancer in her maternal grandfather; Stroke in her  maternal grandmother; Thyroid disease in her mother and sister.   Medications: reviewed and updated   Objective:   Physical Exam BP 103/69   Pulse 75   Temp 98.5 F (36.9 C) (Oral)   Ht 5' 8.75" (1.746 m)   Wt 185 lb 3.2 oz (84 kg)   LMP 07/23/2023 (Exact Date)   SpO2 99%   BMI 27.55 kg/m   Physical Exam HENT:     Head: Normocephalic and atraumatic.     Nose: Nose normal.     Mouth/Throat:     Mouth: Mucous membranes are moist.     Pharynx: Oropharynx is clear.  Eyes:     Extraocular Movements: Extraocular movements intact.     Conjunctiva/sclera: Conjunctivae normal.     Pupils: Pupils are equal, round, and reactive to light.  Cardiovascular:     Rate and Rhythm: Normal rate and regular rhythm.     Pulses: Normal pulses.     Heart sounds: Normal heart sounds.  Pulmonary:     Effort: Pulmonary effort is normal.     Breath sounds: Normal breath sounds.  Musculoskeletal:        General: Normal range of motion.     Right shoulder: Normal.     Left shoulder: Normal.     Right upper arm: Normal.     Left upper arm: Normal.     Right elbow: Normal.     Left elbow: Normal.     Right forearm: Normal.     Left forearm: Normal.     Right wrist: Normal.     Left wrist: Normal.  Right hand: Normal.     Left hand: Normal.     Cervical back: Normal range of motion and neck supple.     Comments: Warts of bilateral palmar hands, no drainage, no additional presentation.  Neurological:     General: No focal deficit present.     Mental Status: She is alert and oriented to person, place, and time.  Psychiatric:        Mood and Affect: Mood normal.        Behavior: Behavior normal.       Assessment & Plan:  1. Encounter to establish care - Patient presents today to establish care. During the interim follow-up with primary provider as scheduled.  - Return for annual physical examination, labs, and health maintenance. Arrive fasting meaning having no food for at least 8  hours prior to appointment. You may have only water or black coffee. Please take scheduled medications as normal.  2. Wart of hand - Referral to Dermatology for further evaluation/management. - Ambulatory referral to Dermatology    Patient was given clear instructions to go to Emergency Department or return to medical center if symptoms don't improve, worsen, or new problems develop.The patient verbalized understanding.  I discussed the assessment and treatment plan with the patient. The patient was provided an opportunity to ask questions and all were answered. The patient agreed with the plan and demonstrated an understanding of the instructions.   The patient was advised to call back or seek an in-person evaluation if the symptoms worsen or if the condition fails to improve as anticipated.    Ricky Stabs, NP 08/11/2023, 9:37 AM Primary Care at [facility]

## 2023-08-14 ENCOUNTER — Telehealth (INDEPENDENT_AMBULATORY_CARE_PROVIDER_SITE_OTHER): Payer: Managed Care, Other (non HMO) | Admitting: Obstetrics & Gynecology

## 2023-08-14 DIAGNOSIS — F902 Attention-deficit hyperactivity disorder, combined type: Secondary | ICD-10-CM

## 2023-08-14 DIAGNOSIS — E282 Polycystic ovarian syndrome: Secondary | ICD-10-CM | POA: Diagnosis not present

## 2023-08-14 DIAGNOSIS — F419 Anxiety disorder, unspecified: Secondary | ICD-10-CM

## 2023-08-14 NOTE — Progress Notes (Unsigned)
Virtual Visit via Video Note  I connected with Ana [name] on 08/15/23 at  9:55 AM EDT by a video enabled telemedicine application and verified that I am speaking with the correct person using two identifiers.  Location: Patient: home Provider: private office at [facility]   I discussed the limitations of evaluation and management by telemedicine and the availability of in person appointments. The patient expressed understanding and agreed to proceed.  History of Present Illness: 37 yo MWF who is undergoing treatment for fertility and is also moving towards being a foster parent.  She sent a form through mychart at the end of last week requesting the form be filled out.  She has established care with a new PCP who will not do this form until she is an established patient for more than 90 days.  Patient's prior ob/gyn has retired and removed away as well.  Pt advised to plan virtual visit to address questions in form.  I have seen patient earlier this year due to an endometrial polyp and ovarian cyst and for a surgical consult.  Ultimately she decided to see reproductive endocrinologist for consultation and additional management.  In the time I've know her, I've found her to have reasonable thought processes and good judgement.  Reviewed history.   Pt has anxiety which is without medications and only behavioral modification.  She does have hx of ADHD and rarely uses medication for this.  Also has hx of PCOS and is on metformin for this.    No hx of communicable diseases.  No recent travel outside of the country.  No TB exposures.   Observations/Objective: WNWD WF, NAD  Assessment and Plan: 1. Attention deficit hyperactivity disorder (ADHD), combined type  2. PCOS (polycystic ovarian syndrome)  3. Anxiety   Follow Up Instructions: Form will be filled out for patient and emailed to her and scanned into EPIC.  Pt knows to call with any questions/concerns.   I discussed  the assessment and treatment plan with the patient. The patient was provided an opportunity to ask questions and all were answered. The patient agreed with the plan and demonstrated an understanding of the instructions.   I provided 16 minutes of non-face-to-face time during this encounter.   Jerene Bears, MD

## 2023-08-15 ENCOUNTER — Encounter (HOSPITAL_BASED_OUTPATIENT_CLINIC_OR_DEPARTMENT_OTHER): Payer: Self-pay | Admitting: Obstetrics & Gynecology

## 2023-08-15 DIAGNOSIS — F419 Anxiety disorder, unspecified: Secondary | ICD-10-CM | POA: Insufficient documentation

## 2023-08-29 ENCOUNTER — Telehealth (HOSPITAL_BASED_OUTPATIENT_CLINIC_OR_DEPARTMENT_OTHER): Payer: Managed Care, Other (non HMO) | Admitting: Obstetrics & Gynecology

## 2023-11-20 ENCOUNTER — Telehealth: Payer: Self-pay | Admitting: Family

## 2023-11-20 NOTE — Telephone Encounter (Signed)
error 

## 2023-11-21 ENCOUNTER — Ambulatory Visit: Payer: Managed Care, Other (non HMO) | Admitting: Family

## 2023-11-21 VITALS — BP 113/78 | HR 87 | Temp 99.2°F | Ht 68.5 in | Wt 179.2 lb

## 2023-11-21 DIAGNOSIS — Z0289 Encounter for other administrative examinations: Secondary | ICD-10-CM | POA: Diagnosis not present

## 2023-11-21 NOTE — Progress Notes (Signed)
Patient states she needs a letter from her PCP to saying that there are no concerns regarding her mental ability to foster.

## 2023-11-21 NOTE — Progress Notes (Signed)
Patient ID: Ana [name], female    DOB: March 21, 1986  MRN: [id number]  CC: Foster Parent Letter   Subjective: Ana [name] is a 37 y.o. female who presents for foster parent letter.   Her concerns today include:  States her and her husband are planning to become foster parents soon. States she needs letter from primary care provider for supporting documentation. Reports she has letter from established therapist and gynecologist and has for my review. No further issues/concerns for discussion today.   Patient Active Problem List   Diagnosis Date Noted   Anxiety 08/15/2023   Right ovarian cyst 01/19/2023   Attention deficit hyperactivity disorder (ADHD), combined type 04/21/2021   PCOS (polycystic ovarian syndrome) 01/14/2019     Current Outpatient Medications on File Prior to Visit  Medication Sig Dispense Refill   metformin (FORTAMET) 1000 MG (OSM) 24 hr tablet Take 1,000 mg by mouth daily with breakfast.     Prenatal Vit-Fe Fumarate-FA (MULTIVITAMIN-PRENATAL) 27-0.8 MG TABS tablet Take 1 tablet by mouth daily at 12 noon.     No current facility-administered medications on file prior to visit.    No Known Allergies  Social History   Socioeconomic History   Marital status: Married    Spouse name: Not on file   Number of children: Not on file   Years of education: Not on file   Highest education level: Not on file  Occupational History   Not on file  Tobacco Use   Smoking status: Never   Smokeless tobacco: Never  Vaping Use   Vaping status: Never Used  Substance and Sexual Activity   Alcohol use: Yes    Comment: rare   Drug use: No   Sexual activity: Yes    Partners: Male    Birth control/protection: None  Other Topics Concern   Not on file  Social History Narrative   Not on file   Social Drivers of Health   Financial Resource Strain: Not on file  Food Insecurity: Not on file  Transportation Needs: Not on file  Physical Activity: Not on file  Stress:  Not on file  Social Connections: Not on file  Intimate Partner Violence: Not on file    Family History  Problem Relation Age of Onset   Thyroid disease Mother    Hypertension Sister    Thyroid disease Sister    Stroke Maternal Grandmother    Heart failure Maternal Grandmother    Prostate cancer Maternal Grandfather    Diabetes Maternal Grandfather     No past surgical history on file.  ROS: Review of Systems Negative except as stated above  PHYSICAL EXAM: BP 113/78   Pulse 87   Temp 99.2 F (37.3 C) (Oral)   Ht 5' 8.5" (1.74 m)   Wt 179 lb 3.2 oz (81.3 kg)   LMP  (LMP Unknown)   SpO2 98%   BMI 26.85 kg/m   Physical Exam HENT:     Head: Normocephalic and atraumatic.     Nose: Nose normal.     Mouth/Throat:     Mouth: Mucous membranes are moist.     Pharynx: Oropharynx is clear.  Eyes:     Extraocular Movements: Extraocular movements intact.     Conjunctiva/sclera: Conjunctivae normal.     Pupils: Pupils are equal, round, and reactive to light.  Cardiovascular:     Rate and Rhythm: Normal rate and regular rhythm.     Pulses: Normal pulses.     Heart sounds: Normal  heart sounds.  Pulmonary:     Effort: Pulmonary effort is normal.     Breath sounds: Normal breath sounds.  Musculoskeletal:        General: Normal range of motion.     Cervical back: Normal range of motion and neck supple.  Neurological:     General: No focal deficit present.     Mental Status: She is alert and oriented to person, place, and time.  Psychiatric:        Mood and Affect: Mood normal.        Behavior: Behavior normal.    ASSESSMENT AND PLAN: Note - I discussed in detail with Georganna Skeans, MD.   1. Encounter for completion of form with patient (Primary) - Letter completed today in office states patient does not have any medical concerns preventing becoming foster parent.   Patient was given the opportunity to ask questions.  Patient verbalized understanding of the plan and  was able to repeat key elements of the plan. Patient was given clear instructions to go to Emergency Department or return to medical center if symptoms don't improve, worsen, or new problems develop.The patient verbalized understanding.  Follow-up with primary provider as scheduled.   Rema Fendt, NP

## 2023-11-22 ENCOUNTER — Encounter: Payer: Self-pay | Admitting: Family

## 2023-11-24 NOTE — Telephone Encounter (Signed)
Noted  

## 2024-09-24 ENCOUNTER — Other Ambulatory Visit (HOSPITAL_BASED_OUTPATIENT_CLINIC_OR_DEPARTMENT_OTHER)

## 2024-09-24 ENCOUNTER — Ambulatory Visit (HOSPITAL_BASED_OUTPATIENT_CLINIC_OR_DEPARTMENT_OTHER): Admitting: Obstetrics & Gynecology

## 2024-09-24 ENCOUNTER — Encounter (HOSPITAL_BASED_OUTPATIENT_CLINIC_OR_DEPARTMENT_OTHER): Payer: Self-pay | Admitting: Obstetrics & Gynecology

## 2024-09-24 VITALS — BP 112/68 | HR 96 | Wt 187.2 lb

## 2024-09-24 DIAGNOSIS — N83201 Unspecified ovarian cyst, right side: Secondary | ICD-10-CM

## 2024-09-24 DIAGNOSIS — E282 Polycystic ovarian syndrome: Secondary | ICD-10-CM

## 2024-09-24 DIAGNOSIS — N840 Polyp of corpus uteri: Secondary | ICD-10-CM

## 2024-09-24 NOTE — Progress Notes (Unsigned)
   GYNECOLOGY  VISIT  CC:  Pelvic Exam   HPI: 38 y.o. G0P0000 Married White or Caucasian female here for Current complaints: Patient reports that her cyst on her right ovary has been causing  pelvic pain for several months on the right side only.  LMP: 09/04/2024  Last pap: 03/17/2022. Results were: NILM w/ HRHPV negative. H/O abnormal pap: no Last mammogram: Family h/o breast cancer: no Last colonoscopy: Family h/o colorectal cancer: no.   Past Medical History:  Diagnosis Date   ADHD    Anxiety    Infertility, female    Irregular menstrual cycle    PCOS (polycystic ovarian syndrome)     MEDS:  Reviewed in EPIC  ALLERGIES: Patient has no known allergies.  SH:  ***  ROS  PHYSICAL EXAMINATION:    BP 112/68 (BP Location: Left Arm, Patient Position: Sitting, Cuff Size: Normal)   Pulse 96   Wt 187 lb 3.2 oz (84.9 kg)   LMP 09/04/2024 (Approximate)   SpO2 100%   BMI 28.05 kg/m     General appearance: alert, cooperative and appears stated age Neck: no adenopathy, supple, symmetrical, trachea midline and thyroid  {CHL AMB PHY EX THYROID  NORM [phone]::normal to inspection and palpation} CV:  {Exam; heart brief:31539} Lungs:  {pe lungs ob:314451} Breasts: {Exam; breast:13139::normal appearance, no masses or tenderness} Abdomen: soft, non-tender; bowel sounds normal; no masses,  no organomegaly Lymph:  no inguinal LAD noted  Pelvic: External genitalia:  no lesions              Urethra:  normal appearing urethra with no masses, tenderness or lesions              Bartholins and Skenes: normal                 Vagina: {exam; pelvic vaginal:30846}              Cervix: {CHL AMB PHY EX CERVIX NORM [phone]::no lesions}              Bimanual Exam:  Uterus:  {CHL AMB PHY EX UTERUS NORM DEFAULT:[phone]::normal size, contour, position, consistency, mobility, non-tender}              Adnexa: {CHL AMB PHY EX ADNEXA NO MASS [phone]::no mass,  fullness, tenderness}              Rectovaginal: {yes no:314532}.  Confirms.              Anus:  normal sphincter tone, no lesions  Chaperone was present for exam.  Assessment/Plan: There are no diagnoses linked to this encounter.

## 2024-09-24 NOTE — Progress Notes (Deleted)
   ANNUAL EXAM Patient name: Ana [name] MRN [id number]  Date of birth: September 08, 1986 Chief Complaint:   No chief complaint on file.  History of Present Illness:   Ana [name] is a 38 y.o. G0P0000 Caucasian female being seen today for a routine annual exam.  Current complaints: *** Pelvic Pain  No LMP recorded.   The pregnancy intention screening data noted above was reviewed. Potential methods of contraception were discussed. The patient elected to proceed with No data recorded.   Last pap 03/17/2022. Results were: NILM w/ HRHPV negative. H/O abnormal pap: {yes/yes***/no:23866} Last mammogram: Family h/o breast cancer: {yes***/no:23838} Last colonoscopy: Family h/o colorectal cancer: {yes***/no:23838}     01/16/2023   10:52 AM  Depression screen PHQ 2/9  Decreased Interest 3  Down, Depressed, Hopeless 1  PHQ - 2 Score 4  Altered sleeping 3  Tired, decreased energy 3  Change in appetite 2  Feeling bad or failure about yourself  2  Trouble concentrating 3  Moving slowly or fidgety/restless 0  Suicidal thoughts 2  PHQ-9 Score 19         No data to display           Review of Systems:   Pertinent items are noted in HPI Denies any headaches, blurred vision, fatigue, shortness of breath, chest pain, abdominal pain, abnormal vaginal discharge/itching/odor/irritation, problems with periods, bowel movements, urination, or intercourse unless otherwise stated above. Pertinent History Reviewed:  Reviewed past medical,surgical, social and family history.  Reviewed problem list, medications and allergies. Physical Assessment:  There were no vitals filed for this visit.There is no height or weight on file to calculate BMI.        Physical Examination:   General appearance - well appearing, and in no distress  Mental status - alert, oriented to person, place, and time  Psych:  She has a normal mood and affect  Skin - warm and dry, normal color, no suspicious lesions  noted  Chest - effort normal, all lung fields clear to auscultation bilaterally  Heart - normal rate and regular rhythm  Neck:  midline trachea, no thyromegaly or nodules  Breasts - breasts appear normal, no suspicious masses, no skin or nipple changes or  axillary nodes  Abdomen - soft, nontender, nondistended, no masses or organomegaly  Pelvic - VULVA: normal appearing vulva with no masses, tenderness or lesions  VAGINA: normal appearing vagina with normal color and discharge, no lesions  CERVIX: normal appearing cervix without discharge or lesions, no CMT  Thin prep pap is {Desc; done/not:10129} *** HR HPV cotesting  UTERUS: uterus is felt to be normal size, shape, consistency and nontender   ADNEXA: No adnexal masses or tenderness noted.  Rectal - normal rectal, good sphincter tone, no masses felt. Hemoccult: ***  Extremities:  No swelling or varicosities noted  Chaperone present for exam  No results found for this or any previous visit (from the past 24 hours).  Assessment & Plan:  1) Well-Woman Exam  2) ***  Labs/procedures today: ***  Mammogram: {Mammo f/u:25212::@ 36yo}, or sooner if problems Colonoscopy: {TCS f/u:25213::@ 36yo}, or sooner if problems  No orders of the defined types were placed in this encounter.   Meds: No orders of the defined types were placed in this encounter.   Follow-up: No follow-ups on file.  Morna LOISE Quale, RN 09/24/2024 8:39 AM

## 2024-10-09 ENCOUNTER — Ambulatory Visit (HOSPITAL_BASED_OUTPATIENT_CLINIC_OR_DEPARTMENT_OTHER)

## 2024-10-09 ENCOUNTER — Encounter (HOSPITAL_BASED_OUTPATIENT_CLINIC_OR_DEPARTMENT_OTHER): Payer: Self-pay | Admitting: Obstetrics & Gynecology

## 2024-10-09 ENCOUNTER — Ambulatory Visit (HOSPITAL_BASED_OUTPATIENT_CLINIC_OR_DEPARTMENT_OTHER): Admitting: Obstetrics & Gynecology

## 2024-10-09 VITALS — BP 120/69 | HR 81 | Wt 185.0 lb

## 2024-10-09 DIAGNOSIS — N83201 Unspecified ovarian cyst, right side: Secondary | ICD-10-CM | POA: Diagnosis not present

## 2024-10-09 DIAGNOSIS — N840 Polyp of corpus uteri: Secondary | ICD-10-CM

## 2024-10-09 NOTE — Progress Notes (Signed)
   Ultrasound f/u Patient name: Ana [name] MRN [id number]  Date of birth: 1986-03-06 Chief Complaint:   Follow-up  History of Present Illness:   Ana [name] is a 38 y.o. G0P0000 Caucasian female being seen today for discussion of ultrasound findings.  H/o large, benign appearing ovarian cyst.  Surgery was planned in 2024 but cancelled by pt as she was considering IVF.  Ultimately did not pursue this.  Here for repeat ultrasound and decisions about management.    Uterus 7.6 x 4.3 x 5.1cm without fibroids.  Endometrium 7.38mm with single area of blood flow c/w endometrial polyp.  Right ovary measured 8.4 x 5.7 x 8.1cm with avascular cystic lesion measuring 7.8 x 5.2 x 7.9cm.  This is slightly enlarged.  Left ovary is normal.    As ovarian cyst is a bit larger, feel surgical excision is reasonable.  This does appear benign but has not decreased in size.  Possible endometrial poly is present as well.  Discussed laparoscopic ovarian cyst removal as well as hysteroscopy with polyp removal.  .Procedure discussed with patient.  Hospital stay, recovery and pain management all discussed.  Risks discussed including but not limited to bleeding, <1% risk of receiving a  transfusion, infection, <1% risk of bowel/bladder/ureteral/vascular injury discussed as well as possible need for additional surgery if injury does occur discussed.  DVT/PE and rare risk of death discussed.  My actual complications with prior surgeries discussed.  Positioning and incision locations discussed.  Patient aware if pathology abnormal she may need additional treatment.  All questions answered.  Will proceed with scheduling.  Pt would like to have this done in early 2026.    Patient's last menstrual period was 09/04/2024 (approximate).   Last pap 03/17/2022. Results were: NILM w/ HRHPV negative.   Review of Systems:   Pertinent items are noted in HPI Pertinent History Reviewed:  Reviewed past medical,surgical, social and family  history.  Reviewed problem list, medications and allergies. Physical Assessment:   Vitals:   10/09/24 1329  BP: 120/69  Pulse: 81  SpO2: 98%  Weight: 185 lb (83.9 kg)  Body mass index is 27.72 kg/m.        Physical Examination:   General appearance - well appearing, and in no distress  Mental status - alert, oriented to person, place, and time  Psych:  She has a normal mood and affect   Assessment & Plan:  1. Right ovarian cyst (Primary) - will proceed with surgery scheduling for laparoscopic right ovarian cystectomy, possible RSO, hysteroscopy with polyp resection.  Questions answered.   - Ambulatory Referral For Surgery Scheduling  2. Endometrial polyp   Orders Placed This Encounter  Procedures   Ambulatory Referral For Surgery Scheduling    Ronal GORMAN Pinal, MD 10/13/2024 8:22 AM GYNECOLOGY  VISIT

## 2024-10-11 ENCOUNTER — Telehealth: Payer: Self-pay

## 2024-10-11 NOTE — Telephone Encounter (Signed)
 I called patient to schedule surgery w/ Dr. Cleotilde. Patient chose 01/01/24 at 10 am. Pre-op instructions were provided and surgery details were discussed.

## 2024-10-13 DIAGNOSIS — N840 Polyp of corpus uteri: Secondary | ICD-10-CM | POA: Insufficient documentation

## 2024-10-15 ENCOUNTER — Encounter (HOSPITAL_BASED_OUTPATIENT_CLINIC_OR_DEPARTMENT_OTHER): Payer: Self-pay

## 2024-10-16 ENCOUNTER — Other Ambulatory Visit (HOSPITAL_BASED_OUTPATIENT_CLINIC_OR_DEPARTMENT_OTHER): Payer: Self-pay | Admitting: Obstetrics & Gynecology

## 2024-10-16 DIAGNOSIS — Z01818 Encounter for other preprocedural examination: Secondary | ICD-10-CM

## 2024-10-16 DIAGNOSIS — N83201 Unspecified ovarian cyst, right side: Secondary | ICD-10-CM

## 2024-10-16 DIAGNOSIS — N840 Polyp of corpus uteri: Secondary | ICD-10-CM

## 2024-12-23 ENCOUNTER — Encounter (HOSPITAL_BASED_OUTPATIENT_CLINIC_OR_DEPARTMENT_OTHER): Payer: Self-pay | Admitting: Obstetrics & Gynecology

## 2024-12-23 ENCOUNTER — Other Ambulatory Visit (HOSPITAL_COMMUNITY)
Admission: RE | Admit: 2024-12-23 | Discharge: 2024-12-23 | Disposition: A | Discharge status: Home / Self Care | Source: Ambulatory Visit | Attending: Obstetrics & Gynecology | Admitting: Obstetrics & Gynecology

## 2024-12-23 ENCOUNTER — Ambulatory Visit (INDEPENDENT_AMBULATORY_CARE_PROVIDER_SITE_OTHER): Admitting: Obstetrics & Gynecology

## 2024-12-23 VITALS — BP 121/73 | HR 75 | Ht 68.5 in | Wt 190.8 lb

## 2024-12-23 DIAGNOSIS — Z1331 Encounter for screening for depression: Secondary | ICD-10-CM

## 2024-12-23 DIAGNOSIS — N840 Polyp of corpus uteri: Secondary | ICD-10-CM

## 2024-12-23 DIAGNOSIS — Z1151 Encounter for screening for human papillomavirus (HPV): Secondary | ICD-10-CM

## 2024-12-23 DIAGNOSIS — N83201 Unspecified ovarian cyst, right side: Secondary | ICD-10-CM

## 2024-12-23 DIAGNOSIS — E282 Polycystic ovarian syndrome: Secondary | ICD-10-CM | POA: Diagnosis not present

## 2024-12-23 DIAGNOSIS — Z01419 Encounter for gynecological examination (general) (routine) without abnormal findings: Secondary | ICD-10-CM

## 2024-12-23 DIAGNOSIS — Z124 Encounter for screening for malignant neoplasm of cervix: Secondary | ICD-10-CM

## 2024-12-23 LAB — COMPREHENSIVE METABOLIC PANEL WITH GFR
ALT: 10 IU/L (ref 0–32)
AST: 14 IU/L (ref 0–40)
Albumin: 4.2 g/dL (ref 3.9–4.9)
Alkaline Phosphatase: 58 IU/L (ref 41–116)
BUN/Creatinine Ratio: 16 (ref 9–23)
BUN: 14 mg/dL (ref 6–20)
Bilirubin Total: <0.2 mg/dL (ref 0.0–1.2)
CO2: 24 mmol/L (ref 20–29)
Calcium: 9.3 mg/dL (ref 8.7–10.2)
Chloride: 105 mmol/L (ref 96–106)
Creatinine, Ser: 0.88 mg/dL (ref 0.57–1.00)
Globulin, Total: 2.4 g/dL (ref 1.5–4.5)
Glucose: 92 mg/dL (ref 70–99)
Potassium: 4.7 mmol/L (ref 3.5–5.2)
Sodium: 140 mmol/L (ref 134–144)
Total Protein: 6.6 g/dL (ref 6.0–8.5)
eGFR: 87 mL/min/1.73

## 2024-12-23 LAB — CBC
Hematocrit: 35.4 % (ref 34.0–46.6)
Hemoglobin: 10.7 g/dL — ABNORMAL LOW (ref 11.1–15.9)
MCH: 26.2 pg — ABNORMAL LOW (ref 26.6–33.0)
MCHC: 30.2 g/dL — ABNORMAL LOW (ref 31.5–35.7)
MCV: 87 fL (ref 79–97)
Platelets: 400 x10E3/uL (ref 150–450)
RBC: 4.08 x10E6/uL (ref 3.77–5.28)
RDW: 14.5 % (ref 11.7–15.4)
WBC: 8.8 x10E3/uL (ref 3.4–10.8)

## 2024-12-23 LAB — LIPID PANEL
Chol/HDL Ratio: 2.5 ratio (ref 0.0–4.4)
Cholesterol, Total: 182 mg/dL (ref 100–199)
HDL: 74 mg/dL
LDL Chol Calc (NIH): 93 mg/dL (ref 0–99)
Triglycerides: 81 mg/dL (ref 0–149)
VLDL Cholesterol Cal: 15 mg/dL (ref 5–40)

## 2024-12-23 LAB — HEMOGLOBIN A1C
Est. average glucose Bld gHb Est-mCnc: 111 mg/dL
Hgb A1c MFr Bld: 5.5 % (ref 4.8–5.6)

## 2024-12-23 NOTE — Progress Notes (Signed)
 "  ANNUAL EXAM Patient name: Ana [name] MRN [id number]  Date of birth: [birth year] Chief Complaint:   Gynecologic Exam  History of Present Illness:   Ana [name] is a 39 y.o. G0P0000 Caucasian female being seen today for a routine annual exam.  Was a busy holiday with all four kids.  Cycles are about 4-5 weeks apart.  Flow lasts 4-5 days.  Positive depression screening today.  D/w pt.  Improved from one year ago.  Reports hx of significant anxiety which was improved since having experience in last 2025 that really helped these symptoms improve.  Does not feel she needs any medical therapy at this time.    Still in the adoption process for her kids who are all siblings.  Biological mother filed paperwork on final day which will prolong the process by at least another year.    She is scheduled for surgery next week.  She is can have laparoscopic excision of left ovarian cyst as well as hysteroscopy with polyp resection and D&C due to endometrial polyp.  Patient has undergone several ultrasounds with the right ovary measuring about 8-1/2 x 6 x 8 cm.  Smooth-walled with an avascular cyst measuring about 8 x 5 x 8 cm.  Most recent ovarian volume was 204 mL.  Patient is aware that removal of that entire ovary might be necessary but I am going to try and save the ovary.  She does not want any tubal procedure performed on the opposite side.  She is not actively planning pregnancy but does not want to eliminate this possibility completely.  The surgery was reviewed.  Risks including bleeding, transfusion, infection, bowel/bladder/ureteral injury, DVT/PE are reviewed.  We have discussed this in the past.  Incision locations were discussed.  Outpatient nature of this procedure was discussed.  Postoperative do's and don'ts were discussed.  This will all be written for patient and given to her postoperatively.  Questions answered.  LMP: 12/13/2024  Last pap 03/17/2022. Results were: NILM w/ HRHPV negative. H/O  abnormal pap: no Last mammogram: Family h/o breast cancer: no Last colonoscopy: Family h/o colorectal cancer: no     12/23/2024    3:39 PM 01/16/2023   10:52 AM  Depression screen PHQ 2/9  Decreased Interest 1 3  Down, Depressed, Hopeless 0 1  PHQ - 2 Score 1 4  Altered sleeping 1 3  Tired, decreased energy 1 3  Change in appetite 1 2  Feeling bad or failure about yourself  2 2  Trouble concentrating 2 3  Moving slowly or fidgety/restless 0 0  Suicidal thoughts 0 2  PHQ-9 Score 8 19   Difficult doing work/chores Very difficult      Data saved with a previous flowsheet row definition        12/23/2024    3:39 PM  GAD 7 : Generalized Anxiety Score  Nervous, Anxious, on Edge 1  Control/stop worrying 0  Worry too much - different things 1  Trouble relaxing 1  Restless 1  Easily annoyed or irritable 2  Afraid - awful might happen 0  Total GAD 7 Score 6     Review of Systems:   Pertinent items are noted in HPI Denies any urinary and bowel changes.  Denies pelvic pain.   Pertinent History Reviewed:  Reviewed past medical,surgical, social and family history.  Reviewed problem list, medications and allergies. Physical Assessment:   Vitals:   12/23/24 1535  BP: 121/73  Pulse: 75  SpO2: 100%  Weight:  190 lb 12.8 oz (86.5 kg)  Height: 5' 8.5 (1.74 m)  Body mass index is 28.59 kg/m.        Physical Examination:   General appearance - well appearing, and in no distress  Mental status - alert, oriented to person, place, and time  Psych:  She has a normal mood and affect  Skin - warm and dry, normal color, no suspicious lesions noted  Chest - effort normal, all lung fields clear to auscultation bilaterally  Heart - normal rate and regular rhythm  Neck:  midline trachea, no thyromegaly or nodules  Breasts - breasts appear normal, no suspicious masses, no skin or nipple changes or  axillary nodes  Abdomen - soft, nontender, nondistended, no masses or  organomegaly  Pelvic - VULVA: normal appearing vulva with no masses, tenderness or lesions   VAGINA: normal appearing vagina with normal color and discharge, no lesions   CERVIX: normal appearing cervix without discharge or lesions, no CMT  Thin prep pap is done with HR HPV cotesting  UTERUS: uterus is felt to be normal size, shape, consistency and nontender   ADNEXA: No adnexal masses or tenderness noted.  Rectal - deferred  Extremities:  No swelling or varicosities noted  Chaperone present for exam  No results found for this or any previous visit (from the past 24 hours).  Assessment & Plan:  1. Well woman exam with routine gynecological exam (Primary) - Pap smear updated today - Mammogram not indicated at this time - Colonoscopy due starting age 50 - vaccines reviewed/updated  2. Cervical cancer screening - Cytology - PAP( [facility])  3. PCOS (polycystic ovarian syndrome) - Comprehensive metabolic panel with GFR - Hemoglobin A1c - Lipid panel - not on metformin but wants to consider starting after surgery is completed  4. Endometrial polyp - procedure for removal scheduled for next week - CBC  5. Right ovarian cyst - laparoscopic removal of ovarian cyst scheduled for next week   Orders Placed This Encounter  Procedures   Comprehensive metabolic panel with GFR   Hemoglobin A1c   Lipid panel   CBC    Meds: No orders of the defined types were placed in this encounter.    Ronal GORMAN Pinal, MD 12/23/2024 5:43 PM "

## 2024-12-24 ENCOUNTER — Ambulatory Visit (HOSPITAL_BASED_OUTPATIENT_CLINIC_OR_DEPARTMENT_OTHER): Payer: Self-pay | Admitting: Obstetrics & Gynecology

## 2024-12-24 LAB — CYTOLOGY - PAP
Comment: NEGATIVE
Diagnosis: NEGATIVE
High risk HPV: NEGATIVE

## 2024-12-26 ENCOUNTER — Encounter (HOSPITAL_COMMUNITY): Payer: Self-pay | Admitting: Obstetrics & Gynecology

## 2024-12-26 NOTE — Progress Notes (Signed)
 Spoke w/ Ana [name] via phone for pre-op interview Lab needs dos----         Lab results------ COVID test -----patient states asymptomatic no test needed Arrive at 0530 NPO after midnight Pre-Surgery Ensure or G2:n/a  Med rec completed Medications to take morning of surgery none Diabetic medication n/a  GLP1 agonist last dose:n/a GLP1 instructions:n/a  Patient instructed no nail polish to be worn day of surgery Patient instructed to bring photo id and insurance card day of surgery Patient aware to have Driver (ride ) / caregiver Ana [name]   for 24 hours after surgery -  Patient Special Instructions ----- Pre-Op special Instructions -----  Patient verbalized understanding of instructions that were given at this phone interview. Patient denies chest pain, sob, fever, cough at the interview.   Patient was very upset that surgery was not scheduled at the time she talked to Dr Cleotilde about.

## 2024-12-31 ENCOUNTER — Other Ambulatory Visit (HOSPITAL_BASED_OUTPATIENT_CLINIC_OR_DEPARTMENT_OTHER): Payer: Self-pay | Admitting: Obstetrics & Gynecology

## 2024-12-31 ENCOUNTER — Ambulatory Visit (HOSPITAL_COMMUNITY): Admitting: Anesthesiology

## 2024-12-31 ENCOUNTER — Encounter (HOSPITAL_COMMUNITY)
Admission: RE | Disposition: A | Discharge status: Home / Self Care | Payer: Self-pay | Source: Home / Self Care | Attending: Obstetrics & Gynecology

## 2024-12-31 ENCOUNTER — Other Ambulatory Visit (HOSPITAL_COMMUNITY): Payer: Self-pay

## 2024-12-31 ENCOUNTER — Encounter (HOSPITAL_COMMUNITY): Payer: Self-pay | Admitting: Obstetrics & Gynecology

## 2024-12-31 ENCOUNTER — Ambulatory Visit (HOSPITAL_COMMUNITY)
Admission: RE | Admit: 2024-12-31 | Discharge: 2024-12-31 | Disposition: A | Attending: Obstetrics & Gynecology | Admitting: Obstetrics & Gynecology

## 2024-12-31 DIAGNOSIS — N83201 Unspecified ovarian cyst, right side: Secondary | ICD-10-CM | POA: Diagnosis present

## 2024-12-31 DIAGNOSIS — E282 Polycystic ovarian syndrome: Secondary | ICD-10-CM | POA: Insufficient documentation

## 2024-12-31 DIAGNOSIS — N840 Polyp of corpus uteri: Secondary | ICD-10-CM

## 2024-12-31 DIAGNOSIS — D500 Iron deficiency anemia secondary to blood loss (chronic): Secondary | ICD-10-CM

## 2024-12-31 DIAGNOSIS — Z01818 Encounter for other preprocedural examination: Secondary | ICD-10-CM

## 2024-12-31 DIAGNOSIS — N920 Excessive and frequent menstruation with regular cycle: Secondary | ICD-10-CM

## 2024-12-31 DIAGNOSIS — D270 Benign neoplasm of right ovary: Secondary | ICD-10-CM

## 2024-12-31 LAB — TYPE AND SCREEN
ABO/RH(D): A POS
Antibody Screen: NEGATIVE

## 2024-12-31 LAB — CBC
HCT: 32.8 % — ABNORMAL LOW (ref 36.0–46.0)
Hemoglobin: 10.3 g/dL — ABNORMAL LOW (ref 12.0–15.0)
MCH: 26.8 pg (ref 26.0–34.0)
MCHC: 31.4 g/dL (ref 30.0–36.0)
MCV: 85.2 fL (ref 80.0–100.0)
Platelets: 314 10*3/uL (ref 150–400)
RBC: 3.85 MIL/uL — ABNORMAL LOW (ref 3.87–5.11)
RDW: 16.2 % — ABNORMAL HIGH (ref 11.5–15.5)
WBC: 6.9 10*3/uL (ref 4.0–10.5)
nRBC: 0 % (ref 0.0–0.2)

## 2024-12-31 LAB — ABO/RH: ABO/RH(D): A POS

## 2024-12-31 LAB — POCT PREGNANCY, URINE: Preg Test, Ur: NEGATIVE

## 2024-12-31 LAB — FERRITIN: Ferritin: 13 ng/mL (ref 11–307)

## 2024-12-31 MED ORDER — ORAL CARE MOUTH RINSE: 15.0000 mL | Freq: Once | OROMUCOSAL | Status: AC

## 2024-12-31 MED ORDER — ROCURONIUM BROMIDE 10 MG/ML (PF) SYRINGE
PREFILLED_SYRINGE | INTRAVENOUS | Status: AC
  Filled 2024-12-31: qty 10

## 2024-12-31 MED ORDER — LACTATED RINGERS IV SOLN: INTRAVENOUS | Status: DC

## 2024-12-31 MED ORDER — MEPERIDINE HCL 25 MG/ML IJ SOLN: 6.2500 mg | INTRAMUSCULAR | Status: DC | PRN

## 2024-12-31 MED ORDER — LIDOCAINE-EPINEPHRINE 1 %-1:100000 IJ SOLN
INTRAMUSCULAR | Status: AC
  Filled 2024-12-31: qty 1

## 2024-12-31 MED ORDER — ONDANSETRON HCL 4 MG/2ML IJ SOLN
INTRAMUSCULAR | Status: AC
  Filled 2024-12-31: qty 2

## 2024-12-31 MED ORDER — POVIDONE-IODINE 10 % EX SWAB: 2.0000 | Freq: Once | CUTANEOUS | Status: DC

## 2024-12-31 MED ORDER — DEXAMETHASONE SOD PHOSPHATE PF 10 MG/ML IJ SOLN
INTRAMUSCULAR | Status: DC | PRN
  Administered 2024-12-31: 10 mg via INTRAVENOUS

## 2024-12-31 MED ORDER — ONDANSETRON HCL 4 MG/2ML IJ SOLN
INTRAMUSCULAR | Status: DC | PRN
  Administered 2024-12-31: 4 mg via INTRAVENOUS

## 2024-12-31 MED ORDER — PROPOFOL 500 MG/50ML IV EMUL
INTRAVENOUS | Status: DC | PRN
  Administered 2024-12-31: 55 ug/kg/min via INTRAVENOUS

## 2024-12-31 MED ORDER — PROPOFOL 10 MG/ML IV BOLUS
INTRAVENOUS | Status: AC
  Filled 2024-12-31: qty 20

## 2024-12-31 MED ORDER — KETAMINE HCL 50 MG/5ML IJ SOSY
PREFILLED_SYRINGE | INTRAMUSCULAR | Status: AC
  Filled 2024-12-31: qty 5

## 2024-12-31 MED ORDER — ROCURONIUM BROMIDE 10 MG/ML (PF) SYRINGE
PREFILLED_SYRINGE | INTRAVENOUS | Status: DC | PRN
  Administered 2024-12-31: 70 mg via INTRAVENOUS

## 2024-12-31 MED ORDER — FENTANYL CITRATE (PF) 100 MCG/2ML IJ SOLN
INTRAMUSCULAR | Status: AC
  Filled 2024-12-31: qty 2

## 2024-12-31 MED ORDER — LIDOCAINE 2% (20 MG/ML) 5 ML SYRINGE
INTRAMUSCULAR | Status: AC
  Filled 2024-12-31: qty 5

## 2024-12-31 MED ORDER — SCOPOLAMINE 1 MG/3DAYS TD PT72
1.0000 | MEDICATED_PATCH | TRANSDERMAL | Status: DC
  Administered 2024-12-31: 1 mg via TRANSDERMAL

## 2024-12-31 MED ORDER — ACETAMINOPHEN 500 MG PO TABS
1000.0000 mg | ORAL_TABLET | ORAL | Status: AC
  Administered 2024-12-31: 1000 mg via ORAL

## 2024-12-31 MED ORDER — CHLORHEXIDINE GLUCONATE 0.12 % MT SOLN
OROMUCOSAL | Status: AC
  Filled 2024-12-31: qty 15

## 2024-12-31 MED ORDER — SODIUM CHLORIDE 0.9 % IR SOLN
Status: DC | PRN
  Administered 2024-12-31: 1000 mL

## 2024-12-31 MED ORDER — MIDAZOLAM HCL (PF) 2 MG/2ML IJ SOLN
INTRAMUSCULAR | Status: DC | PRN
  Administered 2024-12-31: 2 mg via INTRAVENOUS

## 2024-12-31 MED ORDER — LIDOCAINE-EPINEPHRINE 1 %-1:100000 IJ SOLN
INTRAMUSCULAR | Status: DC | PRN
  Administered 2024-12-31: 10 mL

## 2024-12-31 MED ORDER — SUGAMMADEX SODIUM 200 MG/2ML IV SOLN
INTRAVENOUS | Status: DC | PRN
  Administered 2024-12-31: 172.4 mg via INTRAVENOUS

## 2024-12-31 MED ORDER — SODIUM CHLORIDE 0.9 % IV SOLN
2.0000 g | INTRAVENOUS | Status: AC
  Administered 2024-12-31: 2 g via INTRAVENOUS

## 2024-12-31 MED ORDER — KETOROLAC TROMETHAMINE 30 MG/ML IJ SOLN: 30.0000 mg | Freq: Once | INTRAMUSCULAR | Status: DC | PRN

## 2024-12-31 MED ORDER — OXYCODONE HCL 5 MG PO TABS
5.0000 mg | ORAL_TABLET | ORAL | 0 refills | Status: AC | PRN
  Filled 2024-12-31: qty 15, 3d supply, fill #0

## 2024-12-31 MED ORDER — OXYCODONE HCL 5 MG PO TABS
5.0000 mg | ORAL_TABLET | Freq: Once | ORAL | Status: AC | PRN
  Administered 2024-12-31: 5 mg via ORAL

## 2024-12-31 MED ORDER — LIDOCAINE 2% (20 MG/ML) 5 ML SYRINGE
INTRAMUSCULAR | Status: DC | PRN
  Administered 2024-12-31: 60 mg via INTRAVENOUS

## 2024-12-31 MED ORDER — ONDANSETRON HCL 4 MG/2ML IJ SOLN: 4.0000 mg | Freq: Once | INTRAMUSCULAR | Status: DC | PRN

## 2024-12-31 MED ORDER — AMISULPRIDE (ANTIEMETIC) 5 MG/2ML IV SOLN: 10.0000 mg | Freq: Once | INTRAVENOUS | Status: DC | PRN

## 2024-12-31 MED ORDER — OXYCODONE HCL 5 MG PO TABS
ORAL_TABLET | ORAL | Status: AC
  Filled 2024-12-31: qty 1

## 2024-12-31 MED ORDER — HYDROMORPHONE HCL 1 MG/ML IJ SOLN: 0.2500 mg | INTRAMUSCULAR | Status: DC | PRN

## 2024-12-31 MED ORDER — ONDANSETRON 4 MG PO TBDP
4.0000 mg | ORAL_TABLET | Freq: Three times a day (TID) | ORAL | 0 refills | Status: AC | PRN
  Filled 2024-12-31: qty 20, 7d supply, fill #0

## 2024-12-31 MED ORDER — DEXAMETHASONE SOD PHOSPHATE PF 10 MG/ML IJ SOLN
INTRAMUSCULAR | Status: AC
  Filled 2024-12-31: qty 1

## 2024-12-31 MED ORDER — FENTANYL CITRATE (PF) 250 MCG/5ML IJ SOLN
INTRAMUSCULAR | Status: DC | PRN
  Administered 2024-12-31: 100 ug via INTRAVENOUS

## 2024-12-31 MED ORDER — 0.9 % SODIUM CHLORIDE (POUR BTL) OPTIME
TOPICAL | Status: DC | PRN
  Administered 2024-12-31: 1000 mL

## 2024-12-31 MED ORDER — SCOPOLAMINE 1 MG/3DAYS TD PT72
MEDICATED_PATCH | TRANSDERMAL | Status: AC
  Filled 2024-12-31: qty 1

## 2024-12-31 MED ORDER — BUPIVACAINE HCL (PF) 0.25 % IJ SOLN
INTRAMUSCULAR | Status: AC
  Filled 2024-12-31: qty 30

## 2024-12-31 MED ORDER — MIDAZOLAM HCL 2 MG/2ML IJ SOLN
INTRAMUSCULAR | Status: AC
  Filled 2024-12-31: qty 2

## 2024-12-31 MED ORDER — PROPOFOL 10 MG/ML IV BOLUS
INTRAVENOUS | Status: DC | PRN
  Administered 2024-12-31: 200 mg via INTRAVENOUS

## 2024-12-31 MED ORDER — KETAMINE HCL 10 MG/ML IJ SOLN
INTRAMUSCULAR | Status: DC | PRN
  Administered 2024-12-31: 10 mg via INTRAVENOUS
  Administered 2024-12-31: 20 mg via INTRAVENOUS
  Administered 2024-12-31 (×2): 10 mg via INTRAVENOUS

## 2024-12-31 MED ORDER — IBUPROFEN 600 MG PO TABS
600.0000 mg | ORAL_TABLET | Freq: Four times a day (QID) | ORAL | 0 refills | Status: AC | PRN
  Filled 2024-12-31: qty 30, 8d supply, fill #0

## 2024-12-31 MED ORDER — CHLORHEXIDINE GLUCONATE 0.12 % MT SOLN
15.0000 mL | Freq: Once | OROMUCOSAL | Status: AC
  Administered 2024-12-31: 15 mL via OROMUCOSAL

## 2024-12-31 MED ORDER — GABAPENTIN 100 MG PO CAPS
100.0000 mg | ORAL_CAPSULE | ORAL | Status: AC
  Administered 2024-12-31: 100 mg via ORAL

## 2024-12-31 MED ORDER — GABAPENTIN 100 MG PO CAPS
ORAL_CAPSULE | ORAL | Status: AC
  Filled 2024-12-31: qty 1

## 2024-12-31 MED ORDER — ACETAMINOPHEN 500 MG PO TABS
ORAL_TABLET | ORAL | Status: AC
  Filled 2024-12-31: qty 2

## 2024-12-31 MED ORDER — OXYCODONE HCL 5 MG/5ML PO SOLN: 5.0000 mg | Freq: Once | ORAL | Status: AC | PRN

## 2024-12-31 MED ORDER — KETOROLAC TROMETHAMINE 30 MG/ML IJ SOLN
INTRAMUSCULAR | Status: DC | PRN
  Administered 2024-12-31: 30 mg via INTRAVENOUS

## 2024-12-31 MED ORDER — PHENYLEPHRINE HCL-NACL 20-0.9 MG/250ML-% IV SOLN
INTRAVENOUS | Status: DC | PRN
  Administered 2024-12-31: 80 ug via INTRAVENOUS

## 2024-12-31 MED ORDER — SODIUM CHLORIDE 0.9 % IV SOLN
INTRAVENOUS | Status: AC
  Filled 2024-12-31: qty 2

## 2024-12-31 MED ORDER — BUPIVACAINE HCL (PF) 0.25 % IJ SOLN
INTRAMUSCULAR | Status: DC | PRN
  Administered 2024-12-31: 12 mL

## 2024-12-31 NOTE — Discharge Instructions (Signed)
 Post-surgical Instructions, Outpatient Surgery  You may expect to feel dizzy, weak, and drowsy for as long as 24 hours after receiving the medicine that made you sleep (anesthetic). For the first 24 hours after your surgery:   Do not drive a car, ride a bicycle, participate in physical activities, or take public transportation until you are done taking narcotic pain medicines or as directed by Dr. Cleotilde.  Do not drink alcohol or take tranquilizers.  Do not take medicine that has not been prescribed by your physicians.  Do not sign important papers or make important decisions while on narcotic pain medicines.  Have a responsible person with you.   CARE OF INCISION If you have a bandage, you may remove it in one day.  If there are steri-strips or dermabond, just let this loosen on its own.  You may shower on the first day after your surgery.  Do not sit in a tub bath for one week. Avoid heavy lifting (more than 10 pounds/4.5 kilograms), pushing, or pulling.  Avoid activities that may risk injury to your incisions.   PAIN MANAGEMENT Motrin  600mg .  (This is the same as 4-200mg  over the counter tablets of Motrin  or ibuprofen .)  You may take this every eight hours or as needed for cramping.   Oxycodone  5mg .  For more severe pain, take one or two tablets every four to six hours as needed for pain control.  (Remember that narcotic pain medications increase your risk of constipation.  If this becomes a problem, you may take an over the counter stool softener like Colace 100mg  up to four times a day.) If you don't need the narcotic pain medication, you can take 600mg  ibuprofen  and alternate this with 1000mg  Tylenol .  You can take one of these every 3 hours as needed.  DO'S AND DON'T'S Do not take a tub bath for one week.  You may shower on the first day after your surgery Do not do any heavy lifting for one to two weeks.  This increases the chance of bleeding. Do move around as you feel able.  Stairs  are fine.  You may begin to exercise again as you feel able.  Do not lift any weights for two weeks. Do not put anything in the vagina for two weeks--no tampons, intercourse, or douching.    REGULAR MEDIATIONS/VITAMINS: You may restart all of your regular medications as prescribed. You may restart all of your vitamins as you normally take them.    PLEASE CALL OR SEEK MEDICAL CARE IF: You have persistent nausea and vomiting.  You have trouble eating or drinking.  You have an oral temperature above 100.5.  You have constipation that is not helped by adjusting diet or increasing fluid intake. Pain medicines are a common cause of constipation.  You have heavy vaginal bleeding You have redness or drainage from your incision(s) or there is increasing pain or tenderness near or in the surgical site.

## 2024-12-31 NOTE — Anesthesia Procedure Notes (Signed)
 Procedure Name: Intubation Date/Time: 12/31/2024 7:56 AM  Performed by: Elly Pfeiffer, CRNAPre-anesthesia Checklist: Patient identified, Emergency Drugs available, Suction available and Patient being monitored Patient Re-evaluated:Patient Re-evaluated prior to induction Oxygen Delivery Method: Circle system utilized Preoxygenation: Pre-oxygenation with 100% oxygen Induction Type: IV induction Ventilation: Mask ventilation without difficulty Laryngoscope Size: Mac and 3 Grade View: Grade I Tube type: Oral Tube size: 7.5 mm Number of attempts: 1 Airway Equipment and Method: Stylet and Oral airway Placement Confirmation: ETT inserted through vocal cords under direct vision, positive ETCO2 and breath sounds checked- equal and bilateral Secured at: 23 cm Tube secured with: Tape Dental Injury: Teeth and Oropharynx as per pre-operative assessment  Comments: Cords clear; no trauma. CA

## 2024-12-31 NOTE — Anesthesia Preprocedure Evaluation (Addendum)
"                                    Anesthesia Evaluation  Patient identified by MRN, date of birth, ID band Patient awake    Reviewed: Allergy & Precautions, H&P , NPO status , Patient's Chart, lab work & pertinent test results  Airway Mallampati: III  TM Distance: >3 FB Neck ROM: Full    Dental  (+) Teeth Intact, Dental Advisory Given   Pulmonary neg pulmonary ROS   Pulmonary exam normal breath sounds clear to auscultation       Cardiovascular negative cardio ROS Normal cardiovascular exam Rhythm:Regular Rate:Normal     Neuro/Psych  PSYCHIATRIC DISORDERS Anxiety     negative neurological ROS     GI/Hepatic negative GI ROS, Neg liver ROS,,,  Endo/Other  negative endocrine ROS    Renal/GU negative Renal ROS  negative genitourinary   Musculoskeletal negative musculoskeletal ROS (+)    Abdominal   Peds negative pediatric ROS (+)  Hematology negative hematology ROS (+) Hb 10.7, plt 400   Anesthesia Other Findings   Reproductive/Obstetrics negative OB ROS                              Anesthesia Physical Anesthesia Plan  ASA: 1  Anesthesia Plan: General   Post-op Pain Management: Tylenol  PO (pre-op)*, Toradol  IV (intra-op)*, Ketamine  IV*, Dilaudid  IV and Precedex   Induction: Intravenous  PONV Risk Score and Plan: 4 or greater and Ondansetron , Dexamethasone , Scopolamine  patch - Pre-op, Midazolam  and Treatment may vary due to age or medical condition  Airway Management Planned: Oral ETT  Additional Equipment: None  Intra-op Plan:   Post-operative Plan: Extubation in OR  Informed Consent: I have reviewed the patients History and Physical, chart, labs and discussed the procedure including the risks, benefits and alternatives for the proposed anesthesia with the patient or authorized representative who has indicated his/her understanding and acceptance.     Dental advisory given  Plan Discussed with:  CRNA  Anesthesia Plan Comments:          Anesthesia Quick Evaluation  "

## 2024-12-31 NOTE — H&P (Signed)
 Ana [name] is an 39 y.o. female G0 MWF with h/o enlarged right ovary measuring about 8.5cm here for excision of cyst and possible RSO.  Cysts is thin walled and avascular.  Volume about 204.  Also she does have an endometrial polypso removal of this with hysteroscopy is planned today as well.  Pt has undergone several ultrasounds showing persistence of this.  We had planned excision last year but pt decided to consider fertility options but now has a foster family they are moving towards adoption so she has decided to proceed with surgery. Risks and benefits discussed at 12/23/2024 appt.  Questions answered today.    Pertinent Gynecological History: Menses: regular Contraception: none DES exposure: denies Blood transfusions: none Sexually transmitted diseases: no past history Previous GYN Procedures: none  Last mammogram: n/a Last pap: normal  Date:  12/23/2024 OB History: G0, P0   Menstrual History: Patient's last menstrual period was 12/13/2024 (approximate).    Past Medical History:  Diagnosis Date   ADHD    Anxiety    Infertility, female    Irregular menstrual cycle    PCOS (polycystic ovarian syndrome)     History reviewed. No pertinent surgical history.  Family History  Problem Relation Age of Onset   Thyroid  disease Mother    Hypertension Sister    Thyroid  disease Sister    Stroke Maternal Grandmother    Heart failure Maternal Grandmother    Prostate cancer Maternal Grandfather    Diabetes Maternal Grandfather     Social History:  reports that she has never smoked. She has never used smokeless tobacco. She reports current alcohol use. She reports that she does not use drugs.  Allergies: Allergies[1]  Medications Prior to Admission  Medication Sig Dispense Refill Last Dose/Taking   ferrous sulfate 325 (65 FE) MG EC tablet Take 325 mg by mouth 3 (three) times daily with meals.   12/30/2024   Prenatal Vit-Fe Fumarate-FA (MULTIVITAMIN-PRENATAL) 27-0.8 MG TABS tablet  Take 1 tablet by mouth daily at 12 noon. (Patient not taking: No sig reported)   Unknown    Review of Systems  Constitutional: Negative.   Respiratory: Negative.    Cardiovascular: Negative.   Psychiatric/Behavioral: Negative.      Blood pressure 115/76, pulse 82, temperature 98.2 F (36.8 C), temperature source Oral, resp. rate 16, height 5' 8.5 (1.74 m), weight 86.2 kg, last menstrual period 12/13/2024, SpO2 99%. Physical Exam Constitutional:      Appearance: Normal appearance.  HENT:     Head: Normocephalic and atraumatic.  Cardiovascular:     Rate and Rhythm: Normal rate and regular rhythm.  Pulmonary:     Effort: Pulmonary effort is normal.     Breath sounds: Normal breath sounds.  Abdominal:     General: Abdomen is flat.     Palpations: Abdomen is soft.  Musculoskeletal:     Cervical back: Normal range of motion.  Skin:    General: Skin is warm and dry.  Neurological:     Mental Status: She is alert.  Psychiatric:        Mood and Affect: Mood normal.     Results for orders placed or performed during the hospital encounter of 12/31/24 (from the past 24 hours)  Pregnancy, urine POC     Status: None   Collection Time: 12/31/24  6:00 AM  Result Value Ref Range   Preg Test, Ur NEGATIVE NEGATIVE  CBC     Status: Abnormal   Collection Time: 12/31/24  6:32 AM  Result Value Ref Range   WBC 6.9 4.0 - 10.5 K/uL   RBC 3.85 (L) 3.87 - 5.11 MIL/uL   Hemoglobin 10.3 (L) 12.0 - 15.0 g/dL   HCT 67.1 (L) 63.9 - 53.9 %   MCV 85.2 80.0 - 100.0 fL   MCH 26.8 26.0 - 34.0 pg   MCHC 31.4 30.0 - 36.0 g/dL   RDW 83.7 (H) 88.4 - 84.4 %   Platelets 314 150 - 400 K/uL   nRBC 0.0 0.0 - 0.2 %  Type and screen     Status: None (Preliminary result)   Collection Time: 12/31/24  6:32 AM  Result Value Ref Range   ABO/RH(D) PENDING    Antibody Screen PENDING    Sample Expiration      01/03/2025,2359 Performed at [facility], [address], [place], [place] [address]   ABO/Rh      Status: None (Preliminary result)   Collection Time: 12/31/24  6:37 AM  Result Value Ref Range   ABO/RH(D) PENDING     No results found.  Assessment/Plan: 39 yo G0P0 MWF with enlarged right ovary with 8.5cm avascular cyst and endometrial polyp here for laparoscopic right cyst removal, possible RSO and hysteroscopy with polyp resection.  Procedure reviewed and questions answered.  Pt ready to proceed.  Ronal GORMAN Pinal 12/31/2024, 7:11 AM     [1] No Known Allergies

## 2024-12-31 NOTE — Transfer of Care (Signed)
 Immediate Anesthesia Transfer of Care Note  Patient: Ana [name]  Procedure(s) Performed: EXCISION, CYST, OVARY, LAPAROSCOPIC (Right) DILATATION AND CURETTAGE /HYSTEROSCOPY, POLYPECTOMY WITH MYOSURE  Patient Location: PACU  Anesthesia Type:General  Level of Consciousness: drowsy and patient cooperative  Airway & Oxygen Therapy: Patient Spontanous Breathing and Patient connected to face mask oxygen  Post-op Assessment: Report given to RN and Post -op Vital signs reviewed and stable  Post vital signs: stable  Last Vitals:  Vitals Value Taken Time  BP 114/63 12/31/24 09:40  Temp    Pulse 121 12/31/24 09:42  Resp 19 12/31/24 09:42  SpO2 100 % 12/31/24 09:42  Vitals shown include unfiled device data.  Last Pain:  Vitals:   12/31/24 0615  TempSrc: Oral  PainSc: 0-No pain      Patients Stated Pain Goal: 5 (12/31/24 0615)  Complications: No notable events documented.

## 2024-12-31 NOTE — Anesthesia Postprocedure Evaluation (Signed)
"   Anesthesia Post Note  Patient: Ana [name]  Procedure(s) Performed: EXCISION, CYST, OVARY, LAPAROSCOPIC (Right) DILATATION AND CURETTAGE /HYSTEROSCOPY, POLYPECTOMY WITH MYOSURE     Patient location during evaluation: PACU Anesthesia Type: General Level of consciousness: awake and alert, oriented and patient cooperative Pain management: pain level controlled Vital Signs Assessment: post-procedure vital signs reviewed and stable Respiratory status: spontaneous breathing, nonlabored ventilation and respiratory function stable Cardiovascular status: blood pressure returned to baseline and stable Postop Assessment: no apparent nausea or vomiting Anesthetic complications: no   No notable events documented.  Last Vitals:  Vitals:   12/31/24 1000 12/31/24 1015  BP: 122/71 117/71  Pulse: (!) 106 99  Resp: 20 19  Temp:  36.6 C  SpO2: 95% 100%    Last Pain:  Vitals:   12/31/24 1031  TempSrc:   PainSc: 4                  Ana [name]      "

## 2024-12-31 NOTE — Op Note (Signed)
 12/31/2024  9:37 AM  PATIENT:  Ana [name]  39 y.o. female  PRE-OPERATIVE DIAGNOSIS:  Right ovarian cyst polyp  POST-OPERATIVE DIAGNOSIS:  Right ovarian cyst polyp  PROCEDURE:  Procedures: EXCISION, CYST, OVARY, LAPAROSCOPIC DILATATION AND CURETTAGE /HYSTEROSCOPY, POLYPECTOMY WITH MYOSURE  SURGEON:  Ronal GORMAN Pinal  ASSISTANTS: Buzz Carolin, MD.  An experienced assistant was required given the standard of surgical care given the complexity of the case.  This assistant was needed for exposure, dissection, suctioning, retraction, instrument exchange and for overall help during the procedure.  RNFA help was also unavailable.  ANESTHESIA:   general  ESTIMATED BLOOD LOSS: 10 mL  BLOOD ADMINISTERED:none   FLUIDS: 1000ccLR  UOP: 150cc clear UOP  SPECIMEN:  1) pelvic washings  2) right ovarian cyst  3) endometrial polyp and curettings  DISPOSITION OF SPECIMEN:  PATHOLOGY  FINDINGS: enlarged right ovary with simple appearing ovarian cyst. No adhesions present.  Normal upper abdomen.  Normal left ovary and normal bilateral fallopian tubes.  With hysteroscopy, endometrial polyp present.  DESCRIPTION OF OPERATION: Patient is taken to the operating room. She is placed in the supine position. She is a running IV in place. Informed consent was present on the chart. SCDs on her lower extremities and functioning properly. Patient was positioned while she was awake.  Her legs were placed in the low lithotomy position in [employer] stirrups. Her arms were tucked by the side.  General endotracheal anesthesia was administered by the anesthesia staff without difficulty. Dr. Finucane, anesthesia, oversaw case.  Time out performed.    Clora prep was then used to prep the abdomen and Hibiclens  was used to prep the inner thighs, perineum and vagina. Once 3 minutes had past the patient was draped in a normal standard fashion. The legs were lifted to the high lithotomy position. The cervix was visualized by  placing a heavy weighted speculum in the posterior aspect of the vagina and using a curved Deaver retractor to the retract anteriorly. The anterior lip of the cervix was grasped with single-tooth tenaculum.  The cervix sounded to 7 cm. Pratt dilators were used to dilate the cervix up to a #19.  A Hulka clamp was passed through the cervical os and attached to the anterior cervical lip.  The tenaculum was removed. There is also good manipulation of the uterus. A Foley catheter was placed to straight drain.  Clear urine was noted. Legs were lowered to the low lithotomy position and attention was turned the abdomen.  The umbilicus was everted.  Marcaine  0.25% used to anesthetize the skin.  Using #11 blade, 5mm skin incision was made.  A Veress needle was obtained. Syringe of sterile saline was placed on a open Veress needle.  With the abdomen elevated, the Veress needle was passed into the umbilicus until the pop was heard and then fluid started to drip.  Then low flow CO2 gas was attached the needle and the pneumoperitoneum was achieved without difficulty. Once four liters of gas was in the abdomen the Veress needle was removed and a 5 millimeter non-bladed Optiview trocar and port were passed directly to the abdomen. The laparoscope was then used to confirm intraperitoneal placement. Pt was placed in Trendelenburg positioning.  Findings included enlarged right ovary containing a simple appearing ovarian cyst.  Locations for RLQ and LLQ ports were noted by transillumination of the abdominal wall.  0.25% marcaine  was used to anesthetize the skin.  5mm skin incisions were made and 5mm trochar and ports were placed under  direct visualization.  The trochars were removed.    Ovaries, fallopian tubes and ureters were identified.  Cytologic washings were obtained.  Attention was turned to the right side.  Without ever grasping the right fallopian tube, the ovary was elevated and the uterus placed behind it to life the  ovary out of the pelvis.  Incision was made across the ovary with the Harmonic scalpel.  Using the million dollar grasper and micro france dissector forceps, the cyst was freed from the ovary and kept intact.  This was done systematically until the entire cyst was freed.  Then the LLQ 5mm port was removed and a 11/12 nonbladed trochar and port was placed.  The trochar was removed.  A #10 bag was placed in the pelvis and the cyst placed in it. This was brought to the surface of the skin and then the cyst incised.  Clear fluid was present and the ovary decompressed until the specimen, additional clear fluid and bag were removed.  Trochar was replaced.  Ovary and pelvis were irrigated.  No active bleeding was noted.  The ovary was wrapped with surgicel and placed in the pelvis.  The left port was removed and the fascial incision was closed with #0 Vicryl with the Franchot Iha device.  Excellent closure of the fascia was noted.  Device removed.  CO2 pressures were decompressed to watch for any bleeding.  There was none.    At this point the laparoscopic portion of the procedure was ended.  The remaining instruments were removed.  RLQ port was removed under direct visualization of the laparoscope and the pneumoperitoneum was relieved.  The patient was taken out of Trendelenburg positioning.  Several deep breaths were given to the patient's trying to any gas the abdomen and finally the umbilical port was removed.  The skin was then closed with subcuticular stitches of 3-0 Vicryl. The skin was cleansed Dermabond was applied.   Attention was turned to the vagina and the legs were lifted to the high lithotomy position.  Foley catheter was removed.   A bivalve speculum was placed the vagina. Hulka clamp was removed.  The anterior lip of the cervix was grasped with single-tooth tenaculum.  A paracervical block of 1% lidocaine  mixed one-to-one with epinephrine  (1:100,000 units).  10 cc was used total.  Then the  Myosure hysteroscope was obtained.  Normal saline was used as a hysteroscopic fluid. The hysteroscope was advanced through the endocervical canal into the endometrial cavity. The tubal ostia were noted bilaterally. Additional findings included a posterior polyp extending down towards the internal os of the cervix.  Using the [facility] lite device, the polyp was fully excised.  The hysteroscope was removed. A #1 toothed curette was used to curette the cavity until rough gritty texture was noted in all quadrants. With revisualization of the hysteroscope, there was no longer any abnormal findings.  At this point no other procedure was needed and this procedure was ended. The hysteroscope was removed. The fluid deficit was 55 cc. The tenaculum was removed from the anterior lip of the cervix. The speculum was removed from the vagina. The prep was cleansed of the patient's skin. The legs are positioned back in the supine position. Sponge, lap, needle, instrument counts were correct x2. Patient tolerated the procedure very well. She was awakened from anesthesia, extubated and taken to recovery in stable condition.   COUNTS:  YES  PLAN OF CARE: Transfer to PACU

## 2025-01-01 ENCOUNTER — Ambulatory Visit: Payer: Self-pay | Admitting: Family

## 2025-01-01 ENCOUNTER — Encounter (HOSPITAL_COMMUNITY): Payer: Self-pay | Admitting: Obstetrics & Gynecology

## 2025-01-01 LAB — CYTOLOGY - NON PAP

## 2025-01-01 LAB — SURGICAL PATHOLOGY

## 2025-01-28 ENCOUNTER — Encounter (HOSPITAL_BASED_OUTPATIENT_CLINIC_OR_DEPARTMENT_OTHER): Payer: Self-pay | Admitting: Obstetrics & Gynecology

## 2025-02-03 ENCOUNTER — Ambulatory Visit (HOSPITAL_BASED_OUTPATIENT_CLINIC_OR_DEPARTMENT_OTHER): Admitting: Obstetrics & Gynecology
